# Patient Record
Sex: Male | Born: 1980 | Race: Black or African American | Hispanic: No | Marital: Single | Smoking: Current every day smoker
Health system: Southern US, Community
[De-identification: ages and names within clinical notes are randomized; demographics above are authoritative.]

## PROBLEM LIST (undated history)

## (undated) DIAGNOSIS — M25569 Pain in unspecified knee: Secondary | ICD-10-CM

## (undated) DIAGNOSIS — I671 Cerebral aneurysm, nonruptured: Secondary | ICD-10-CM

## (undated) HISTORY — PX: KNEE SURGERY: SHX244

## (undated) HISTORY — PX: BRAIN SURGERY: SHX531

---

## 2014-06-25 ENCOUNTER — Emergency Department (HOSPITAL_COMMUNITY)
Admission: EM | Admit: 2014-06-25 | Discharge: 2014-06-25 | Disposition: A | Discharge status: Home / Self Care | Payer: Self-pay | Attending: Emergency Medicine | Admitting: Emergency Medicine

## 2014-06-25 ENCOUNTER — Encounter (HOSPITAL_COMMUNITY): Payer: Self-pay | Admitting: Emergency Medicine

## 2014-06-25 DIAGNOSIS — Z9889 Other specified postprocedural states: Secondary | ICD-10-CM | POA: Insufficient documentation

## 2014-06-25 DIAGNOSIS — Z8679 Personal history of other diseases of the circulatory system: Secondary | ICD-10-CM | POA: Insufficient documentation

## 2014-06-25 DIAGNOSIS — M25561 Pain in right knee: Secondary | ICD-10-CM

## 2014-06-25 DIAGNOSIS — F17200 Nicotine dependence, unspecified, uncomplicated: Secondary | ICD-10-CM | POA: Insufficient documentation

## 2014-06-25 DIAGNOSIS — M25569 Pain in unspecified knee: Secondary | ICD-10-CM | POA: Insufficient documentation

## 2014-06-25 DIAGNOSIS — G8929 Other chronic pain: Secondary | ICD-10-CM | POA: Insufficient documentation

## 2014-06-25 HISTORY — DX: Pain in unspecified knee: M25.569

## 2014-06-25 HISTORY — DX: Cerebral aneurysm, nonruptured: I67.1

## 2014-06-25 MED ORDER — HYDROCODONE-ACETAMINOPHEN 5-325 MG PO TABS: 1.0000 | ORAL_TABLET | ORAL | Status: DC | PRN

## 2014-06-25 NOTE — ED Provider Notes (Signed)
Medical screening examination/treatment/procedure(s) were performed by non-physician practitioner and as supervising physician I was immediately available for consultation/collaboration.   EKG Interpretation None        Richardean Canalavid H Dawood Spitler, MD 06/25/14 1714

## 2014-06-25 NOTE — ED Provider Notes (Signed)
CSN: [id number]     Arrival date & time 06/25/14  [id number] History   First MD Initiated Contact with Patient 06/25/14 [phone]     Chief Complaint  Patient presents with  . Knee Pain     (Consider location/radiation/quality/duration/timing/severity/associated sxs/prior Treatment) Patient is a 33 y.o. male presenting with knee pain. The history is provided by the patient and medical records.  Knee Pain  This is a 33 y.o. F with PMH significant for chronic right knee pain, presented to the ED for acute exacerbation of pain.  Patient states he recently began working at [employer] and is squatting and lifting heavy items only on a daily basis.  States over the past week, his pain has worsened. He describes it as a headache with intermittent sharp sensation in the middle of his knee. Pain worse with weightbearing and ambulation. Prior sc[place] in [place], however patient is relocating to [facility]. He denies any numbness, paresthesias, or weakness of right leg. He is ambulating without difficulty.  Past Medical History  Diagnosis Date  . Knee pain   . Brain aneurysm    Past Surgical History  Procedure Laterality Date  . Knee surgery Right   . Brain surgery     No family history on file. History  Substance Use Topics  . Smoking status: Current Every Day Smoker  . Smokeless tobacco: Not on file  . Alcohol Use: Yes    Review of Systems  Musculoskeletal: Positive for arthralgias.  All other systems reviewed and are negative.     Allergies  Review of patient's allergies indicates no known allergies.  Home Medications   Prior to Admission medications   Not on File   BP 151/84  Pulse 76  Temp(Src) 98.7 F (37.1 C) (Oral)  Resp 20  Ht 6\' 2"  (1.88 m)  Wt 294 lb (133.358 kg)  BMI 37.73 kg/m2  SpO2 98%  Physical Exam  Nursing note and vitals reviewed. Constitutional: He is oriented to person, place, and time. He appears well-developed and  well-nourished. No distress.  HENT:  Head: Normocephalic and atraumatic.  Mouth/Throat: Oropharynx is clear and moist.  Eyes: Conjunctivae and EOM are normal. Pupils are equal, round, and reactive to light.  Neck: Normal range of motion. Neck supple.  Cardiovascular: Normal rate, regular rhythm and normal heart sounds.   Pulmonary/Chest: Effort normal and breath sounds normal. No respiratory distress. He has no wheezes.  Musculoskeletal: Normal range of motion.       Right knee: He exhibits swelling. He exhibits no ecchymosis, no deformity and no laceration. No tenderness found.  Right knee mildly swollen along medial aspect, no drainable effusion noted; no bony deformities or focal tenderness, full range of motion maintained without difficulty, DP pulse and sensation intact  Neurological: He is alert and oriented to person, place, and time.  Skin: Skin is warm and dry. He is not diaphoretic.  Psychiatric: He has a normal mood and affect.    ED Course  Procedures (including critical care time) Labs Review Labs Reviewed - No data to display  Imaging Review No results found.   EKG Interpretation None      MDM   Final diagnoses:  Knee pain, right   Atraumatic right knee pain x1 week, history of the same.  No appreciable effusion noted. Leg NVI.  Knee sleeve placed to help with swelling. Encouraged RICE routine at home for added relief.  Rx vicodin.  FU with orthopedics.  Discussed  plan with patient, he/she acknowledged understanding and agreed with plan of care.  Return precautions given for new or worsening symptoms.  Garlon Hatchet, PA-C 06/25/14 [phone]

## 2014-06-25 NOTE — Discharge Instructions (Signed)
Take the prescribed medication as directed. °Follow-up with Dr. Murphy-- call and schedule appt. °Return to the ED for new or worsening symptoms. ° °

## 2014-06-25 NOTE — ED Notes (Signed)
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

## 2014-06-25 NOTE — ED Notes (Signed)
Pt c/o right knee pain x 1 week. Pt reports that he had 2 scopes done in the past. Pt started a new job after being out of work for a long time.

## 2014-07-07 ENCOUNTER — Encounter (HOSPITAL_COMMUNITY): Payer: Self-pay | Admitting: Emergency Medicine

## 2014-07-07 ENCOUNTER — Emergency Department (HOSPITAL_COMMUNITY)
Admission: EM | Admit: 2014-07-07 | Discharge: 2014-07-07 | Disposition: A | Discharge status: Home / Self Care | Payer: Self-pay | Attending: Emergency Medicine | Admitting: Emergency Medicine

## 2014-07-07 DIAGNOSIS — Z9889 Other specified postprocedural states: Secondary | ICD-10-CM | POA: Insufficient documentation

## 2014-07-07 DIAGNOSIS — M25569 Pain in unspecified knee: Secondary | ICD-10-CM | POA: Insufficient documentation

## 2014-07-07 DIAGNOSIS — G8929 Other chronic pain: Secondary | ICD-10-CM | POA: Insufficient documentation

## 2014-07-07 DIAGNOSIS — M25561 Pain in right knee: Secondary | ICD-10-CM

## 2014-07-07 DIAGNOSIS — M25469 Effusion, unspecified knee: Secondary | ICD-10-CM | POA: Insufficient documentation

## 2014-07-07 DIAGNOSIS — E669 Obesity, unspecified: Secondary | ICD-10-CM | POA: Insufficient documentation

## 2014-07-07 DIAGNOSIS — F17200 Nicotine dependence, unspecified, uncomplicated: Secondary | ICD-10-CM | POA: Insufficient documentation

## 2014-07-07 DIAGNOSIS — Z8679 Personal history of other diseases of the circulatory system: Secondary | ICD-10-CM | POA: Insufficient documentation

## 2014-07-07 MED ORDER — NAPROXEN 250 MG PO TABS
500.0000 mg | ORAL_TABLET | Freq: Once | ORAL | Status: AC
  Administered 2014-07-07: 500 mg via ORAL
  Filled 2014-07-07: qty 2

## 2014-07-07 MED ORDER — NAPROXEN 500 MG PO TABS: 500.0000 mg | ORAL_TABLET | Freq: Two times a day (BID) | ORAL | Status: DC

## 2014-07-07 NOTE — ED Provider Notes (Signed)
CSN: [id number]     Arrival date & time 07/07/14  1744 History   This chart was scribed for Mellody DrownLauren Lawayne Hartig, PA-C working with Flint MelterElliott L Wentz, MD by Evon Slackerrance Branch, ED Scribe. This patient was seen in room TR08C/TR08C and the patient's care was started at 7:55 PM.   Chief Complaint  Patient presents with  . Knee Pain   HPI Comments: Edward BarrowsJemarqus Manning is a 33 y.o. male who presents to the Emergency Department complaining of right side knee pain. Denies recent injury. He states that movement worsens his pain. He states that this is old basketball injury. He states he has hx of 2 previous knee scopes. He states his pain has recently worsened due to being on his feet for long periods of time while at work.  He states he hasn't taken any medications prior to arrival. He states he has been wearing knee sleeve that haven't provided much relief. He states he was not able to follow up with the orthopedist because he is waiting for his insurance to transfer.  Patient is a 33 y.o. male presenting with knee pain. The history is provided by the patient. No language interpreter was used.  Knee Pain Associated symptoms: no fever       Past Medical History  Diagnosis Date  . Knee pain   . Brain aneurysm    Past Surgical History  Procedure Laterality Date  . Knee surgery Right   . Brain surgery     No family history on file. History  Substance Use Topics  . Smoking status: Current Every Day Smoker  . Smokeless tobacco: Not on file  . Alcohol Use: Yes    Review of Systems  Constitutional: Negative for fever and chills.  Musculoskeletal: Positive for arthralgias, gait problem and joint swelling.    Allergies  Review of patient's allergies indicates no known allergies.  Home Medications   Prior to Admission medications   Not on File   Triage Vitals: BP 139/76  Pulse 77  Temp(Src) 98.1 F (36.7 C)  Resp 18  Ht 6\' 2"  (1.88 m)  Wt 295 lb (133.811 kg)  BMI 37.86 kg/m2  SpO2 95%  Physical  Exam  Nursing note and vitals reviewed. Constitutional: He is oriented to person, place, and time. He appears well-developed and well-nourished.  Non-toxic appearance. He does not have a sickly appearance. He does not appear ill. No distress.  Obese male  HENT:  Head: Normocephalic and atraumatic.  Eyes: Conjunctivae and EOM are normal.  Neck: Neck supple.  Pulmonary/Chest: Effort normal. No respiratory distress.  Musculoskeletal: Normal range of motion.       Right knee: He exhibits swelling. He exhibits normal range of motion, no effusion, no deformity and no erythema. Tenderness found. Medial joint line tenderness noted.  Mild swelling to right knee. No increase in warmth.  Neurological: He is alert and oriented to person, place, and time.  Skin: Skin is warm and dry. He is not diaphoretic.  Psychiatric: He has a normal mood and affect. His behavior is normal.    ED Course  Procedures (including critical care time) DIAGNOSTIC STUDIES: Oxygen Saturation is 95% on RA, adequate by my interpretation.    COORDINATION OF CARE: 8:00 PM-Discussed treatment plan which includes ibuprofen with pt at bedside and pt agreed to plan.     Labs Review Labs Reviewed - No data to display  Imaging Review No results found.   EKG Interpretation None      MDM  Final diagnoses:  Knee pain, chronic, right   Pt presents with chronic right knee pain, previous arthroscopic surgeries in .  Recently seen on 06/25/2014 for similar complaints.  Requesting note for work. Has not followed up with Orthopedic specialist as previously instructed, has not taken any other medications other than Norco.  Able to bear weight. Plan to treat with NSAIDs, advised pt to ice and elevate.  Follow up with ortho.  Discussed that he will not be getting an excuse for work for today and tomorrow but will willing to place on light duty since this is a chronic issue and he has been seen in the past and has not followed up  as instructed. He is requesting to speak to another provider, to give him two days off of work. Discussed with Dr. Effie Shy who agreed to evaluate the patient in the ED. Pt left prior to being seen by Dr. Effie Shy. Meds given in ED:  Medications  naproxen (NAPROSYN) tablet 500 mg (500 mg Oral Given 07/07/14 2023)    Discharge Medication List as of 07/07/2014  8:52 PM    START taking these medications   Details  naproxen (NAPROSYN) 500 MG tablet Take 1 tablet (500 mg total) by mouth 2 (two) times daily with a meal., Starting 07/07/2014, Until Discontinued, Print        I personally performed the services described in this documentation, which was scribed in my presence. The recorded information has been reviewed and is accurate.       Clabe Seal, PA-C 07/08/14 1544

## 2014-07-07 NOTE — Discharge Instructions (Signed)
Call for a follow up appointment with a Family or Primary Care Provider.  Return if Symptoms worsen.   Take medication as prescribed.  Ice your knee 3-4 times a day. Elevate when you are not walking or standing.   Emergency Department Resource Guide 1) Find a Doctor and Pay Out of Pocket Although you won't have to find out who is covered by your insurance plan, it is a good idea to ask around and get recommendations. You will then need to call the office and see if the doctor you have chosen will accept you as a new patient and what types of options they offer for patients who are self-pay. Some doctors offer discounts or will set up payment plans for their patients who do not have insurance, but you will need to ask so you aren't surprised when you get to your appointment.  2) Contact Your Local Health Department Not all health departments have doctors that can see patients for sick visits, but many do, so it is worth a call to see if yours does. If you don't know where your local health department is, you can check in your phone book. The CDC also has a tool to help you locate your state's health department, and many state websites also have listings of all of their local health departments.  3) Find a Walk-in Clinic If your illness is not likely to be very severe or complicated, you may want to try a walk in clinic. These are popping up all over the country in pharmacies, drugstores, and shopping centers. They're usually staffed by nurse practitioners or physician assistants that have been trained to treat common illnesses and complaints. They're usually fairly quick and inexpensive. However, if you have serious medical issues or chronic medical problems, these are probably not your best option.  No Primary Care Doctor: - Call Health Connect at  [phone] - they can help you locate a primary care doctor that  accepts your insurance, provides certain services, etc. - Physician Referral Service-  [phone]  Chronic Pain Problems: Organization         Address  Phone   Notes  [facility]  [phone] Patients need to be referred by their primary care doctor.   Medication Assistance: Organization         Address  Phone   Notes  [facility] Medication [facility] [address] [place], [place] [address] [phone] --Must be a resident of [facility] [place] -- Must have NO insurance coverage whatsoever (no Medicaid/ Medicare, etc.) -- The pt. MUST have a primary care doctor that directs their care regularly and follows them in the community   MedAssist  [phone]   [employer]  [phone]    Agencies that provide inexpensive medical care: Organization         Address  Phone   Notes  [facility]  [phone]   [facility]    [phone]   [facility] [address] [place], [place] [address] [phone]   [facility] [address], [place] [phone]   Planned Parenthood    [phone]   [facility]    [phone]   [facility]  [address], [place] Phone:  [phone], Fax:  [phone]) [phone] Hours of Operation:  9 am - 6 pm, M-F.  Also accepts Medicaid/Medicare and  self-pay.  [facility] for [place], [address], [place] Phone: [phone], Fax: [phone]. Hours of Operation:  8:30 am - 5:30 pm, M-F.  Also accepts Medicaid and self-pay.  [facility] [place] [address] [place] Phone: [phone]   [place], [place], [place] [phone], Ext. [phone] Mondays & Thursdays: 7-9 AM.  First 15 patients are seen on a first come, first serve basis.    [place] Providers:  Organization         Address  Phone   Notes  [facility] [address],  [place] [phone] Also accepts self-pay patients.  [facility] [id number] [place], [place]  [phone]   [place], [address], [place] [phone]   [facility] [address], [place] [phone]   Lucianne Lei [address], [place]   [phone] Only accepts [place] Access [place] patients after they have their name applied to their card.   Self-Pay (no insurance) in [facility]:  Organization         Address  Phone   Notes  Sickle Cell Patients, [facility] [place] [phone]   [facility]gent Care [place] [phone]   [facility] [place]  [place], [place], [place] [phone]   [facility]/Dr. Osei-Bonsu  [address], [place] or [address], [place] [phone] Phone number for both [place] and [place] locations is the same.  Urgent Medical and [facility] [address] [place] [phone]   [facility] [address], [place] or [address] [phone]   [facility] [address] [place] [phone], phone; [phone], fax Sees patients 1st and 3rd Saturday of every month.  Must not qualify for public or private insurance (i.e. Medicaid, Medicare, [place] Health Choice, Veterans' Benefits)  Household income should be no more than 200% of the poverty level The clinic cannot treat you if you are pregnant or think you are pregnant  Sexually transmitted diseases are not treated at the clinic.    Dental Care: Organization         Address  Phone  Notes  [facility] Department of [facility] [place] [phone] Accepts children up to age 83 who are enrolled in  [place] or [place]; pregnant women with a Medicaid card; and children who have applied for Medicaid or [place] Health Choice, but were declined, whose parents can pay a reduced fee at time of service.  [facility] Department of [facility]  [address], [place] [phone] Accepts children up to age 32 who are enrolled in [place] or [place]; pregnant women with a Medicaid card; and children who have applied for Medicaid or [place] Health Choice, but were declined, whose parents can pay a reduced fee at time of service.  [facility] Adult [facility] Access PROGRAM  [place] [phone] Patients are seen by appointment only. Walk-ins are not accepted. [place] will see patients 28 years of age and older. Monday - Tuesday (8am-5pm) Most Wednesdays (8:30-5pm) $  30 per visit, cash only  [facility] Adult [employer] PROGRAM  [address], [facility] [phone] Patients are seen by appointment only. Walk-ins are not accepted. [place] will see patients 66 years of age and older. One Wednesday Evening (Monthly: Volunteer Based).  $30 per visit, cash only  [place]  [phone] for adults; Children under age 85, call Graduate Pediatric Dentistry at [phone]. Children aged 31-14, please call [phone] to request a pediatric application.  Dental services are provided in all areas of dental care including fillings, crowns and bridges, complete and partial dentures, implants, gum treatment, root canals, and extractions. Preventive care is also provided. Treatment is provided to both adults and children. Patients are selected via a lottery and there is often a waiting list.   [facility] [address]  [phone] [email or web address]   [facility] [address] [place], [place] [phone], Ext. [phone] Second and Fourth Thursday of each month, opens at 6:30  AM; Clinic ends at 9 AM.  Patients are seen on a first-come first-served basis, and a limited number are seen during each clinic.   [facility]  [address] [place], [place] [phone]   Eligibility Requirements You must have lived in [place], [place], or [place] counties for at least the last three months.   You cannot be eligible for state or federal sponsored [employer], including [employer], [place], or [employer].   You generally cannot be eligible for healthcare insurance through your employer.    How to apply: Eligibility screenings are held every Tuesday and Wednesday afternoon from 1:00 pm until 4:00 pm. You do not need an appointment for the interview!  [facility] [address] [place], [place]   [place]  [place] Department  [place]  [phone]    Behavioral Health Resources in the Community: Intensive Outpatient Programs Organization         Address  Phone  Notes  [place]. [address] [place], [place] [phone]   [facility] [address] [place], [place]   ADS: Alcohol & Drug Svcs [address], [place], [place]   [place] [address],  [place], [place] or [phone]   Substance Abuse Resources Organization         Address  Phone  Notes  Alcohol and Drug Services  [phone]   [place]  [phone]   [place]   Chinita Pester  [phone]   Residential & Outpatient Substance Abuse Program  [phone]   Psychological Services Organization         Address  Phone  Notes  [facility] [place]  [place]  [phone]   [place] [address] [place] or  [phone]    Mobile Crisis Teams Organization         Address  Phone  Notes  Therapeutic Alternatives, Mobile Crisis Care Unit  [phone]   Assertive Psychotherapeutic Services  [address] [place], [place]   [place] Levels [address], [place] [phone]    Self-Help/Support Groups Organization         Address  Phone  Notes  Mental Health Assoc. of [place] - variety of support groups  [place] Call for more information  Narcotics Anonymous (NA), Caring Services [address], [employer] [place]  2 meetings at this location   Special educational needs teacher         Address  Phone  Notes  ASAP Residential Treatment [place],    [place]  [phone]   [facility]  [address] [place] [id number], [place], [place]   [facility] [place], [place] [phone] Admissions: 8am-3pm M-F  Incentives Substance [place] [address]    [place], [place] [phone]   [facility] [address] [place], [place], [place]   [facility] [address],  [place], [place]   Insight Programs - Intensive Outpatient [address] Kristeen Mans [address], [place], [place]   [facility] ([place] [address]  [place], [place] [phone] or [phone]   [facility]idential Treatment Services (RTS) [address], [place], [place] Accepts Medicaid  [facility] [place] [address]  [place] 1-8254049705 Substance Abuse/Addiction Treatment   [facility] Organization         Address  Phone  Notes  [facility]  [phone]   Domenic Schwab, PhD [address] Arlis Porta [place], [place]   [phone] or [phone]   [place], [place] [phone]   [facility] [address],  [place], [place] [phone] Insurance/Medicaid/sponsorship through [facility] and Families [address] [place]                                    [place], [place] [phone] [place] [address] [place], [place] [phone]    Dr. Adele Schilder  [phone]   [facility] Dept. 1) [address] [place] 2) [place] 3)  [address], [place] [phone]  [phone]   [place] [phone] or [phone] (After Hours)

## 2014-07-07 NOTE — ED Notes (Signed)
Encouraged patient to follow up with ortho.  Stated that he has an appointment on Sept 8th when his insurance goes into effect.

## 2014-07-07 NOTE — ED Notes (Signed)
The pt has had rt knee pain for a long time for 2-3 years chronically.  More pain for the past week

## 2014-07-07 NOTE — ED Notes (Signed)
Pt. Upset because he has not received a note for work. States "my lively hood is an emergency. I will lose my job if I don't get a note for work". Reasoned with the patient, he is agreeable to wait for the attending physician to see him.

## 2014-07-08 NOTE — ED Provider Notes (Signed)
Medical screening examination/treatment/procedure(s) were performed by non-physician practitioner and as supervising physician I was immediately available for consultation/collaboration.  Flint MelterElliott L Jady Braggs, MD 07/08/14 [phone]

## 2014-07-19 ENCOUNTER — Encounter (HOSPITAL_COMMUNITY): Payer: Self-pay | Admitting: Emergency Medicine

## 2014-07-19 ENCOUNTER — Emergency Department (HOSPITAL_COMMUNITY)
Admission: EM | Admit: 2014-07-19 | Discharge: 2014-07-19 | Disposition: A | Discharge status: Home / Self Care | Payer: Self-pay | Attending: Emergency Medicine | Admitting: Emergency Medicine

## 2014-07-19 ENCOUNTER — Emergency Department (HOSPITAL_COMMUNITY): Payer: Self-pay

## 2014-07-19 DIAGNOSIS — M25561 Pain in right knee: Secondary | ICD-10-CM

## 2014-07-19 DIAGNOSIS — Z8679 Personal history of other diseases of the circulatory system: Secondary | ICD-10-CM | POA: Insufficient documentation

## 2014-07-19 DIAGNOSIS — M25569 Pain in unspecified knee: Secondary | ICD-10-CM | POA: Insufficient documentation

## 2014-07-19 DIAGNOSIS — F17200 Nicotine dependence, unspecified, uncomplicated: Secondary | ICD-10-CM | POA: Insufficient documentation

## 2014-07-19 MED ORDER — NAPROXEN 500 MG PO TABS: 500.0000 mg | ORAL_TABLET | Freq: Two times a day (BID) | ORAL | Status: DC

## 2014-07-19 MED ORDER — TRAMADOL HCL 50 MG PO TABS: 50.0000 mg | ORAL_TABLET | Freq: Four times a day (QID) | ORAL | Status: DC | PRN

## 2014-07-19 MED ORDER — TRAMADOL HCL 50 MG PO TABS
50.0000 mg | ORAL_TABLET | Freq: Four times a day (QID) | ORAL | Status: DC | PRN
Start: 2014-07-19 — End: 2015-01-04

## 2014-07-19 NOTE — ED Provider Notes (Signed)
CSN: [id number]     Arrival date & time 07/19/14  0520 History   First MD Initiated Contact with Patient 07/19/14 [phone]     Chief Complaint  Patient presents with  . Knee Pain     (Consider location/radiation/quality/duration/timing/severity/associated sxs/prior Treatment) HPI Edward Manning is a 33 y.o. male  who presents emergency department complaining of right knee pain. Patient with chronic knee pain after a college injury, with prior knee surgery. He states in the last month he has had increased pain in the leg. He states couple days ago he stepped off the porch wrong and since then his knee has been swollen and throbbing. Patient states he has been taking Tylenol for his pain with very little relief. He states he just moved to this area, his orthopedic specialist is in [place]. 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 Patient denies any pain in his thigh or in his calf. He denies any new major injuries. He denies any numbness or weakness in extremities. He denies any swelling in his legs. Patient states that he has had to have cortisone injections in the past which have worked. He denies any other complaints.  Past Medical History  Diagnosis Date  . Knee pain   . Brain aneurysm    Past Surgical History  Procedure Laterality Date  . Knee surgery Right   . Brain surgery     History reviewed. No pertinent family history. History  Substance Use Topics  . Smoking status: Current Every Day Smoker  . Smokeless tobacco: Not on file  . Alcohol Use: Yes    Review of Systems  Constitutional: Negative for fever and chills.  Respiratory: Negative for cough, chest tightness and shortness of breath.   Cardiovascular: Negative for chest pain, palpitations and leg swelling.  Genitourinary: Negative for dysuria, urgency, frequency and hematuria.   Musculoskeletal: Positive for arthralgias and joint swelling. Negative for neck pain and neck stiffness.  Skin: Negative for rash.  Allergic/Immunologic: Negative for immunocompromised state.  Neurological: Negative for dizziness, weakness, light-headedness, numbness and headaches.      Allergies  Review of patient's allergies indicates no known allergies.  Home Medications   Prior to Admission medications   Medication Sig Start Date End Date Taking? Authorizing Provider  acetaminophen (TYLENOL) 500 MG tablet Take 1,000 mg by mouth every 6 (six) hours as needed for moderate pain.   Yes Historical Provider, MD   BP 119/72  Pulse 71  Temp(Src) 98.1 F (36.7 C) (Oral)  Resp 20  Ht 6\' 2"  (1.88 m)  Wt 290 lb (131.543 kg)  BMI 37.22 kg/m2  SpO2 98% Physical Exam  Nursing note and vitals reviewed. Constitutional: He appears well-developed and well-nourished. No distress.  HENT:  Head: Normocephalic and atraumatic.  Eyes: Conjunctivae are normal.  Neck: Neck supple.  Cardiovascular: Normal rate, regular rhythm and normal heart sounds.   Pulmonary/Chest: Effort normal. No respiratory distress. He has no wheezes. He has no rales.  Musculoskeletal: He exhibits no edema.  Normal appearing right knee. Tender to palpation over medial and anterior joint. Full ROM of the knee joint. Pain with full flexion and extension. Negative anterior and posterior drawer signs. No laxity or pain with medial or lateral stress. No lower extremity swelling. Negative Homans sign. Pain or tenderness. Dorsal pedal pulses intact.   Neurological: He  is alert.  Skin: Skin is warm and dry.    ED Course  Procedures (including critical care time) Labs Review Labs Reviewed - No data to display  Imaging Review Dg Knee Complete 4 Views Right  07/19/2014   CLINICAL DATA:  Worsening right knee pain.  EXAM: RIGHT KNEE - COMPLETE 4+ VIEW  COMPARISON:  None.  FINDINGS: There is no evidence of fracture or  dislocation. The joint spaces are preserved. Marginal osteophytes are seen arising at all three compartments, and small wall osteophytes are seen.  Trace joint fluid remains within normal limits. The visualized soft tissues are normal in appearance.  IMPRESSION: 1. No evidence of fracture or dislocation. 2. Mild tricompartmental osteoarthritis noted.   Electronically Signed   By: Roanna Raider M.D.   On: 07/19/2014 06:11     EKG Interpretation None      MDM   Final diagnoses:  Right knee pain    Pt with right chronic knee pain. No recent injuries. Knee does not appear to be significantly swollen, no signs of infection. Home with NSAIDs, ultram, follow up with pcp.   Filed Vitals:   07/19/14 0534  BP: 119/72  Pulse: 71  Temp: 98.1 F (36.7 C)  TempSrc: Oral  Resp: 20  Height: 6\' 2"  (1.88 m)  Weight: 290 lb (131.543 kg)  SpO2: 98%      Lottie Mussel, PA-C 07/19/14 [phone]

## 2014-07-19 NOTE — ED Notes (Signed)
Pt complains of chronic knee pain, old basketball injury, nothing lately

## 2014-07-19 NOTE — Discharge Instructions (Signed)
Naprosyn for pain. Ultram for severe pain. Continue to keep elevated. Ice. Follow up with orthopedics specialist.   Knee Pain The knee is the complex joint between your thigh and your lower leg. It is made up of bones, tendons, ligaments, and cartilage. The bones that make up the knee are:  The femur in the thigh.  The tibia and fibula in the lower leg.  The patella or kneecap riding in the groove on the lower femur. CAUSES  Knee pain is a common complaint with many causes. A few of these causes are:  Injury, such as:  A ruptured ligament or tendon injury.  Torn cartilage.  Medical conditions, such as:  Gout  Arthritis  Infections  Overuse, over training, or overdoing a physical activity. Knee pain can be minor or severe. Knee pain can accompany debilitating injury. Minor knee problems often respond well to self-care measures or get well on their own. More serious injuries may need medical intervention or even surgery. SYMPTOMS The knee is complex. Symptoms of knee problems can vary widely. Some of the problems are:  Pain with movement and weight bearing.  Swelling and tenderness.  Buckling of the knee.  Inability to straighten or extend your knee.  Your knee locks and you cannot straighten it.  Warmth and redness with pain and fever.  Deformity or dislocation of the kneecap. DIAGNOSIS  Determining what is wrong may be very straight forward such as when there is an injury. It can also be challenging because of the complexity of the knee. Tests to make a diagnosis may include:  Your caregiver taking a history and doing a physical exam.  Routine X-rays can be used to rule out other problems. X-rays will not reveal a cartilage tear. Some injuries of the knee can be diagnosed by:  Arthroscopy a surgical technique by which a small video camera is inserted through tiny incisions on the sides of the knee. This procedure is used to examine and repair internal knee joint  problems. Tiny instruments can be used during arthroscopy to repair the torn knee cartilage (meniscus).  Arthrography is a radiology technique. A contrast liquid is directly injected into the knee joint. Internal structures of the knee joint then become visible on X-ray film.  An MRI scan is a non X-ray radiology procedure in which magnetic fields and a computer produce two- or three-dimensional images of the inside of the knee. Cartilage tears are often visible using an MRI scanner. MRI scans have largely replaced arthrography in diagnosing cartilage tears of the knee.  Blood work.  Examination of the fluid that helps to lubricate the knee joint (synovial fluid). This is done by taking a sample out using a needle and a syringe. TREATMENT The treatment of knee problems depends on the cause. Some of these treatments are:  Depending on the injury, proper casting, splinting, surgery, or physical therapy care will be needed.  Give yourself adequate recovery time. Do not overuse your joints. If you begin to get sore during workout routines, back off. Slow down or do fewer repetitions.  For repetitive activities such as cycling or running, maintain your strength and nutrition.  Alternate muscle groups. For example, if you are a weight lifter, work the upper body on one day and the lower body the next.  Either tight or weak muscles do not give the proper support for your knee. Tight or weak muscles do not absorb the stress placed on the knee joint. Keep the muscles surrounding the knee  strong.  Take care of mechanical problems.  If you have flat feet, orthotics or special shoes may help. See your caregiver if you need help.  Arch supports, sometimes with wedges on the inner or outer aspect of the heel, can help. These can shift pressure away from the side of the knee most bothered by osteoarthritis.  A brace called an "unloader" brace also may be used to help ease the pressure on the most  arthritic side of the knee.  If your caregiver has prescribed crutches, braces, wraps or ice, use as directed. The acronym for this is PRICE. This means protection, rest, ice, compression, and elevation.  Nonsteroidal anti-inflammatory drugs (NSAIDs), can help relieve pain. But if taken immediately after an injury, they may actually increase swelling. Take NSAIDs with food in your stomach. Stop them if you develop stomach problems. Do not take these if you have a history of ulcers, stomach pain, or bleeding from the bowel. Do not take without your caregiver's approval if you have problems with fluid retention, heart failure, or kidney problems.  For ongoing knee problems, physical therapy may be helpful.  Glucosamine and chondroitin are over-the-counter dietary supplements. Both may help relieve the pain of osteoarthritis in the knee. These medicines are different from the usual anti-inflammatory drugs. Glucosamine may decrease the rate of cartilage destruction.  Injections of a corticosteroid drug into your knee joint may help reduce the symptoms of an arthritis flare-up. They may provide pain relief that lasts a few months. You may have to wait a few months between injections. The injections do have a small increased risk of infection, water retention, and elevated blood sugar levels.  Hyaluronic acid injected into damaged joints may ease pain and provide lubrication. These injections may work by reducing inflammation. A series of shots may give relief for as long as 6 months.  Topical painkillers. Applying certain ointments to your skin may help relieve the pain and stiffness of osteoarthritis. Ask your pharmacist for suggestions. Many over the-counter products are approved for temporary relief of arthritis pain.  In some countries, doctors often prescribe topical NSAIDs for relief of chronic conditions such as arthritis and tendinitis. A review of treatment with NSAID creams found that they  worked as well as oral medications but without the serious side effects. PREVENTION  Maintain a healthy weight. Extra pounds put more strain on your joints.  Get strong, stay limber. Weak muscles are a common cause of knee injuries. Stretching is important. Include flexibility exercises in your workouts.  Be smart about exercise. If you have osteoarthritis, chronic knee pain or recurring injuries, you may need to change the way you exercise. This does not mean you have to stop being active. If your knees ache after jogging or playing basketball, consider switching to swimming, water aerobics, or other low-impact activities, at least for a few days a week. Sometimes limiting high-impact activities will provide relief.  Make sure your shoes fit well. Choose footwear that is right for your sport.  Protect your knees. Use the proper gear for knee-sensitive activities. Use kneepads when playing volleyball or laying carpet. Buckle your seat belt every time you drive. Most shattered kneecaps occur in car accidents.  Rest when you are tired. SEEK MEDICAL CARE IF:  You have knee pain that is continual and does not seem to be getting better.  SEEK IMMEDIATE MEDICAL CARE IF:  Your knee joint feels hot to the touch and you have a high fever. MAKE SURE YOU:  Understand these instructions.  Will watch your condition.  Will get help right away if you are not doing well or get worse. Document Released: 09/23/2007 Document Revised: 02/18/2012 Document Reviewed: 09/23/2007 [facility] Patient Information 2015 [place], [place]. This information is not intended to replace advice given to you by your health care provider. Make sure you discuss any questions you have with your health care provider.

## 2014-07-19 NOTE — ED Provider Notes (Signed)
Medical screening examination/treatment/procedure(s) were performed by non-physician practitioner and as supervising physician I was immediately available for consultation/collaboration.   EKG Interpretation None        Dewon Mendizabal F Vandy Tsuchiya, MD 07/19/14 0737 

## 2014-10-28 ENCOUNTER — Encounter (HOSPITAL_COMMUNITY): Payer: Self-pay | Admitting: Emergency Medicine

## 2014-10-28 ENCOUNTER — Emergency Department (HOSPITAL_COMMUNITY)
Admission: EM | Admit: 2014-10-28 | Discharge: 2014-10-28 | Disposition: A | Discharge status: Home / Self Care | Payer: Self-pay | Attending: Emergency Medicine | Admitting: Emergency Medicine

## 2014-10-28 ENCOUNTER — Emergency Department (HOSPITAL_COMMUNITY): Payer: Self-pay

## 2014-10-28 DIAGNOSIS — Z720 Tobacco use: Secondary | ICD-10-CM | POA: Insufficient documentation

## 2014-10-28 DIAGNOSIS — I671 Cerebral aneurysm, nonruptured: Secondary | ICD-10-CM | POA: Insufficient documentation

## 2014-10-28 DIAGNOSIS — R51 Headache: Secondary | ICD-10-CM | POA: Insufficient documentation

## 2014-10-28 DIAGNOSIS — R519 Headache, unspecified: Secondary | ICD-10-CM

## 2014-10-28 DIAGNOSIS — I729 Aneurysm of unspecified site: Secondary | ICD-10-CM

## 2014-10-28 DIAGNOSIS — Z791 Long term (current) use of non-steroidal anti-inflammatories (NSAID): Secondary | ICD-10-CM | POA: Insufficient documentation

## 2014-10-28 NOTE — ED Provider Notes (Signed)
CSN: [id number]     Arrival date & time 10/28/14  [id number] History   First MD Initiated Contact with Patient 10/28/14 [phone]     Chief Complaint  Patient presents with  . Headache   HPI  Patient is a 33 year old male with a past medical history of a brain aneurysm with clipping performed approximately one year ago who presents to the emergency room with intermittent headache 3-4 days. Patient states that for the past 3-4 days he has had a frontal headache with some sharp intermittent pains that go to the occipital region of his head. At its worst the pain as a 7 out of 10. Currently he is having no pain in his head at this time. Patient states that he feels that the headaches are much worse at nighttime. Especially when he gets home from work. He has been trying Tylenol which takes care of his headaches. He is worried because the headaches come right back pain next day. He feels that this may potentially be due to his aneurysm. He states that the headache is different from when he had his aneurysm. He said these headaches are much less severe than his aneurysm headache. He also reports some dizziness with movements.  Past Medical History  Diagnosis Date  . Knee pain   . Brain aneurysm    Past Surgical History  Procedure Laterality Date  . Knee surgery Right   . Brain surgery     No family history on file. History  Substance Use Topics  . Smoking status: Current Every Day Smoker  . Smokeless tobacco: Not on file  . Alcohol Use: Yes    Review of Systems  Constitutional: Positive for diaphoresis. Negative for fever, chills and fatigue.  HENT: Negative for congestion, ear pain, postnasal drip, rhinorrhea and trouble swallowing.   Musculoskeletal: Negative for neck pain and neck stiffness.  Neurological: Positive for dizziness and headaches. Negative for seizures, speech difficulty, weakness and numbness.  Psychiatric/Behavioral: Negative for confusion.      Allergies  Review of  patient's allergies indicates no known allergies.  Home Medications   Prior to Admission medications   Medication Sig Start Date End Date Taking? Authorizing Provider  acetaminophen (TYLENOL) 500 MG tablet Take 1,000 mg by mouth every 6 (six) hours as needed for moderate pain.   Yes Historical Provider, MD  Pseudoeph-Doxylamine-DM-APAP (NYQUIL PO) Take 1-2 capsules by mouth every 6 (six) hours as needed (sinus pain).   Yes Historical Provider, MD  traMADol (ULTRAM) 50 MG tablet Take 1 tablet (50 mg total) by mouth every 6 (six) hours as needed. 07/19/14  Yes Tatyana A Kirichenko, PA-C  naproxen (NAPROSYN) 500 MG tablet Take 1 tablet (500 mg total) by mouth 2 (two) times daily. 07/19/14   Tatyana A Kirichenko, PA-C   BP 119/73 mmHg  Pulse 77  Temp(Src) 98.6 F (37 C) (Oral)  Resp 15  Ht 6\' 2"  (1.88 m)  Wt 295 lb (133.811 kg)  BMI 37.86 kg/m2  SpO2 99% Physical Exam  Constitutional: He is oriented to person, place, and time. He appears well-developed and well-nourished. No distress.  HENT:  Head: Normocephalic and atraumatic.  Mouth/Throat: Oropharynx is clear and moist. No oropharyngeal exudate.  Eyes: Conjunctivae and EOM are normal. Pupils are equal, round, and reactive to light. No scleral icterus.  Neck: Normal range of motion. Neck supple. No JVD present. No Brudzinski's sign and no Kernig's sign noted. No thyromegaly present.  Cardiovascular: Normal rate, regular rhythm, normal heart sounds and  intact distal pulses.  Exam reveals no gallop and no friction rub.   No murmur heard. Pulmonary/Chest: Effort normal and breath sounds normal. No respiratory distress. He has no wheezes. He has no rales. He exhibits no tenderness.  Abdominal: Soft. Bowel sounds are normal.  Musculoskeletal: Normal range of motion.  Lymphadenopathy:    He has no cervical adenopathy.  Neurological: He is alert and oriented to person, place, and time. He has normal strength. No cranial nerve deficit or  sensory deficit. Coordination normal.  Negative pronator drift. Normal alternating rapid movements.  Skin: Skin is warm and dry. He is not diaphoretic.  Psychiatric: He has a normal mood and affect. His behavior is normal. Judgment and thought content normal.  Nursing note and vitals reviewed.   ED Course  Procedures (including critical care time) Labs Review Labs Reviewed - No data to display  Imaging Review Ct Head Wo Contrast  10/28/2014   CLINICAL DATA:  Headache.  EXAM: CT HEAD WITHOUT CONTRAST  TECHNIQUE: Contiguous axial images were obtained from the base of the skull through the vertex without intravenous contrast.  COMPARISON:  None.  FINDINGS: Status post right frontal craniotomy. Aneurysm clip is seen in the region of the right middle cerebral artery. No mass effect or midline shift is noted. Ventricular size is within normal limits. There is no evidence of mass lesion, hemorrhage or acute infarction.  IMPRESSION: Status post right frontal craniotomy with aneurysm clips seen in region of right middle cerebral artery. No acute intracranial abnormality seen.   Electronically Signed   By: Roque LiasJames  Green M.D.   On: 10/28/2014 08:50     EKG Interpretation None      MDM   Final diagnoses:  Headache  Aneurysm   Patient is a 33 year old male who presents to the ED with intermittent frontal headaches. Physical exam reveals alert and oriented male with no focal neurological deficits. Patient is afebrile in the emergency department. There are no signs of meningitis. Patient is concerned that his aneurysm may be leaking. As there are no focal neurological deficits on examination I do not feel that patient needed imaging at this time, but patient would like to have some imaging performed. I have performed a screening head CT without contrast here in the ED there is evidence of previous craniotomy with aneurysm clips seen, but there are no acute intracranial abnormalities seen on CT scan  today. Suspect that this is likely tension headaches due to stress. Patient instructed to keep a headache diary to find triggers for headaches. Will give patient the resource list and have patient follow-up with a PCP of his choosing. Patient to return for sudden onset of worst headache of his life, severe nausea and vomiting, changes in vision, weakness, or any other concerning symptoms. Patient states understanding and agreement at this time. Patient has been discussed with and seen by Dr. Madilyn Hookees who agrees with the above plan and workup. Patient is stable for discharge at this time.    Eben Burowourtney A Forcucci, PA-C 10/28/14 [id number]  Tilden FossaElizabeth Rees, MD 10/28/14 [phone]

## 2014-10-28 NOTE — ED Notes (Signed)
Patient transported to CT without distress 

## 2014-10-28 NOTE — Discharge Instructions (Signed)
Tension Headache °A tension headache is a feeling of pain, pressure, or aching often felt over the front and sides of the head. The pain can be dull or can feel tight (constricting). It is the most common type of headache. Tension headaches are not normally associated with nausea or vomiting and do not get worse with physical activity. Tension headaches can last 30 minutes to several days.  °CAUSES  °The exact cause is not known, but it may be caused by chemicals and hormones in the brain that lead to pain. Tension headaches often begin after stress, anxiety, or depression. Other triggers may include: °· Alcohol. °· Caffeine (too much or withdrawal). °· Respiratory infections (colds, flu, sinus infections). °· Dental problems or teeth clenching. °· Fatigue. °· Holding your head and neck in one position too long while using a computer. °SYMPTOMS  °· Pressure around the head.   °· Dull, aching head pain.   °· Pain felt over the front and sides of the head.   °· Tenderness in the muscles of the head, neck, and shoulders. °DIAGNOSIS  °A tension headache is often diagnosed based on:  °· Symptoms.   °· Physical examination.   °· A CT scan or MRI of your head. These tests may be ordered if symptoms are severe or unusual. °TREATMENT  °Medicines may be given to help relieve symptoms.  °HOME CARE INSTRUCTIONS  °· Only take over-the-counter or prescription medicines for pain or discomfort as directed by your caregiver.   °· Lie down in a dark, quiet room when you have a headache.   °· Keep a journal to find out what may be triggering your headaches. For example, write down: °¨ What you eat and drink. °¨ How much sleep you get. °¨ Any change to your diet or medicines. °· Try massage or other relaxation techniques.   °· Ice packs or heat applied to the head and neck can be used. Use these 3 to 4 times per day for 15 to 20 minutes each time, or as needed.   °· Limit stress.   °· Sit up straight, and do not tense your muscles.    °· Quit smoking if you smoke. °· Limit alcohol use. °· Decrease the amount of caffeine you drink, or stop drinking caffeine. °· Eat and exercise regularly. °· Get 7 to 9 hours of sleep, or as recommended by your caregiver. °· Avoid excessive use of pain medicine as recurrent headaches can occur.    °SEEK MEDICAL CARE IF:  °· You have problems with the medicines you were prescribed. °· Your medicines do not work. °· You have a change from the usual headache. °· You have nausea or vomiting. °SEEK IMMEDIATE MEDICAL CARE IF:  °· Your headache becomes severe. °· You have a fever. °· You have a stiff neck. °· You have loss of vision. °· You have muscular weakness or loss of muscle control. °· You lose your balance or have trouble walking. °· You feel faint or pass out. °· You have severe symptoms that are different from your first symptoms. °MAKE SURE YOU:  °· Understand these instructions. °· Will watch your condition. °· Will get help right away if you are not doing well or get worse. °Document Released: 11/26/2005 Document Revised: 02/18/2012 Document Reviewed: 11/16/2011 °ExitCare® Patient Information ©2015 [employer]. This information is not intended to replace advice given to you by your health care provider. Make sure you discuss any questions you have with your health care provider. ° ° °Emergency Department Resource Guide °1) Find a Doctor and Pay Out   of Pocket °Although you won't have to find out who is covered by your insurance plan, it is a good idea to ask around and get recommendations. You will then need to call the office and see if the doctor you have chosen will accept you as a new patient and what types of options they offer for patients who are self-pay. Some doctors offer discounts or will set up payment plans for their patients who do not have insurance, but you will need to ask so you aren't surprised when you get to your appointment. ° °2) Contact Your Local Health Department °Not all health  departments have doctors that can see patients for sick visits, but many do, so it is worth a call to see if yours does. If you don't know where your local health department is, you can check in your phone book. The CDC also has a tool to help you locate your state's health department, and many state websites also have listings of all of their local health departments. ° °3) Find a Walk-in Clinic °If your illness is not likely to be very severe or complicated, you may want to try a walk in clinic. These are popping up all over the country in pharmacies, drugstores, and shopping centers. They're usually staffed by nurse practitioners or physician assistants that have been trained to treat common illnesses and complaints. They're usually fairly quick and inexpensive. However, if you have serious medical issues or chronic medical problems, these are probably not your best option. ° °No Primary Care Doctor: °- Call [facility] at  832-8000 - they can help you locate a primary care doctor that  accepts your insurance, provides certain services, etc. °- Physician Referral Service- 1-800-533-3463 ° °Chronic Pain Problems: °Organization         Address  Phone   Notes  °[facility]  (336) 297-2271 Patients need to be referred by their primary care doctor.  ° °Medication Assistance: °Organization         Address  Phone   Notes  °[place] [facility] [address] °[place], [place] [address] (336) 641-8030 --Must be a resident of [place] °-- Must have NO insurance coverage whatsoever (no Medicaid/ Medicare, etc.) °-- The pt. MUST have a primary care doctor that directs their care regularly and follows them in the community °  °[facility]  (866) 331-1348   °[employer]  (888) 892-1162   ° °Agencies that provide inexpensive medical care: °Organization         Address  Phone   Notes  °[facility]  (336) 832-8035   °[facility]     (336) 832-7272   °[facility] [address] °[place], [place] [address] (336) 832-4777   °[facility] [address], °[place] (336) 271-4999   °[facility]    (336) 373-0678   °[facility]    (336) 272-1050   °[facility] ° [address], [place] Phone:  (336) 832-4444, Fax:  (336) 832-4440 Hours of Operation:  9 am - 6 pm, M-F.  Also accepts Medicaid/Medicare and self-pay.  °[facility] ° [address], [place] Phone: (336) 832-3150, Fax: (336) 832-3151. Hours of Operation:  8:30 am - 5:30 pm, M-F.  Also accepts Medicaid and self-pay.  °[facility] [place] [address], [place] Phone: (336) 878-6027   °[facility] [address], [place], [place] (336)723-1848, Ext. 123 Mondays & Thursdays: 7-9 AM.  First 15 patients are seen on a first come, first serve basis. °  ° °Medicaid-accepting [place] Providers: ° °Organization         Address  Phone   Notes  °[facility] [address], [place] (336) 641-2100 Also accepts self-pay patients.  °[facility] [address], [place] ° (336) 856-9996   °[facility] [address], [place] (336) 288-8857   °[facility] [address] [place] [address], [place] (336) 299-7000   °Veita Bland [address], [place]  ° (336) 373-1557 Only accepts [place] Access Medicaid patients after they have their name applied to their card.  ° °Self-Pay (no insurance) in [place]: ° °Organization         Address  Phone   Notes  °Sickle Cell Patients, [facility] [address], [place] (336) 832-1970   °[facility] [address], [place] (336) 832-4400   °[facility] [place] ° [address],  (336) 992-4800     °[facility]/Dr. Osei-Bonsu ° [address] [place] [address], [place] or [address], [place] (336) 841-8500 Phone number for both [place] and [place] locations is the same.  °[facility] [address], [place] (336) 299-0000   °[facility] [address] [place] [address], [place] or [address] (336) 852-7530 °(336) 878-2260   °[facility] [address], [place] (336) 350-1642, phone; (336) 294-5005, fax Sees patients 1st and 3rd Saturday of every month.  Must not qualify for public or private insurance (i.e. Medicaid, Medicare, [facility] Health Choice, Veterans' Benefits) • Household income should be no more than 200% of the poverty level •The clinic cannot treat you if you are pregnant or think you are pregnant • Sexually transmitted diseases are not treated at the clinic.  ° ° °Dental Care: °Organization         Address  Phone  Notes  °[facility] [address], [place] (336) 641-6152 Accepts children up to age 21 who are enrolled in Medicaid or [place] Health Choice; pregnant women with a Medicaid card; and children who have applied for Medicaid or [place] Health Choice, but were declined, whose parents can pay a reduced fee at time of service.  °[facility] [place]  [address], [place] (336) 641-7733 Accepts children up to age 21 who are enrolled in Medicaid or [place] Health Choice; pregnant women with a Medicaid card; and children who have applied for Medicaid or [place] Health Choice, but were declined, whose parents can pay a reduced fee at time of service.  °[facility] ° [address],  (336) 641-4533 Patients are seen by appointment only. Walk-ins are not accepted. [facility] will see patients 18 years of age and older. °Monday - Tuesday (8am-5pm) °Most Wednesdays (8:30-5pm) °$30 per visit,  cash only  °[facility] ° [address], [place] (336) 641-4533 Patients are seen by appointment only. Walk-ins are not accepted. [facility] will see patients 18 years of age and older. °One Wednesday Evening (Monthly: Volunteer Based).  $30 per visit,   cash only  °[facility]  (919) 537-3737 for adults; Children under age 4, call Graduate Pediatric Dentistry at (919) [phone]. Children aged 4-14, please call (919) [phone] to request a pediatric application. ° Dental services are provided in all areas of dental care including fillings, crowns and bridges, complete and partial dentures, implants, gum treatment, root canals, and extractions. Preventive care is also provided. Treatment is provided to both adults and children. °Patients are selected via a lottery and there is often a waiting list. °  °[facility] [address], °[place] ° (336) 763-8833 [email or web address] °  °[facility] [address], [place], [place] (336)723-1848, Ext. 123 Second and Fourth Thursday of each month, opens at 6:30 AM; Clinic ends at 9 AM.  Patients are seen on a first-come first-served basis, and a limited number are seen during each clinic.  ° °[facility] ° [address], [place], [place] (336) 723-7904   Eligibility Requirements °You must have lived in [place], [place], or [place] counties for at least the last three months. °  You cannot be eligible for state or federal sponsored healthcare insurance, including Veterans Administration, Medicaid, or Medicare. °  You generally cannot be eligible for healthcare insurance through your employer.  °  How to apply: °Eligibility screenings are held every Tuesday and Wednesday afternoon from 1:00 pm until 4:00 pm. You do not need an appointment for the interview!  °[facility] [address], [place], [place] 336-631-2330   °[facility]   336-342-8273   °[facility]  336-703-3100   °[facility]  336-570-6415   ° °Behavioral Health Resources in the Community: °Intensive Outpatient Programs °Organization         Address  Phone  Notes  °[facility] [address], [place], [place] 336-878-6098   °[facility] [address], [place], [place] 336-832-9800   °[facility] [address], [place], [place] ° 336-882-2125   °[facility] [address],  °[place], [place] 1-800-853-5163 or 336-641-4981   °Substance Abuse Resources °Organization         Address  Phone  Notes  °[facility]  336-882-2125   °[facility]  336-784-9470   °[facility]  336-285-9073   °[facility]  336-845-3988   °Residential & Outpatient Substance Abuse Program  1-800-659-3381   °Psychological Services °Organization         Address  Phone  Notes  °[facility]  336- 832-9600   °[facility]  336- 378-7881   °[facility] [address], [place] 1-800-853-5163 or 336-641-4981   ° °Mobile Crisis Teams °Organization         Address  Phone  Notes  °[facility], Mobile Crisis Care Unit  1-877-626-1772   °Assertive °[facility] ° [address] [place], [place] 336-834-9664   °Sharon DeEsch [address] °[place] 336-554-5454   ° °Self-Help/Support Groups °Organization         Address  Phone             Notes  °[facility] - variety of support groups  336- 373-1402 Call for more information  °Narcotics Anonymous (NA), [facility] [address], °[place]  2 meetings at this location  ° °Residential Treatment Programs °Organization           Address  Phone  Notes  °[facility] [address],    °[place]  1-866-801-8205   °[facility] ° [address], [place], [place] 704-293-8524    °[facility] [address], [place] 336-845-3988 Admissions: 8am-3pm M-F  °[facility] [address]    °[place], [place] 336-841-1104   °[facility] [address], [place], [place] 336-379-7146   °[facility] [address]  °[place], [place] 336-285-9073   °[facility] - Intensive Outpatient [address], [place], [place] 336-852-3033   °[facility] [address]  °[place], [place] 1-877-615-2722 or 336-784-9470   °[facility] [address] [place], [place] 336-227-7417 Accepts Medicaid  °[facility] [address]  ° [place] 1-800-659-3381 Substance Abuse/Addiction Treatment  ° °[place] Behavioral Health Resources °Organization         Address  Phone  Notes  °[facility]  (888) 581-9988   °Julie Brannon, PhD [address] [place], [place]   (336) 349-5553 or (336) 951-0000   °[facility]   [address] °[place], [place] (336) 349-4454   °[facility] [address], [place], [place] (336) 342-8316 Insurance/Medicaid/sponsorship through [facility]  °[facility] [address]                                    [place], [place] (336) 342-8316 Therapy/tele-psych/case  °[facility] [address]  ° [place],  (336) 349-2233    °Dr. Arfeen  (336) 349-4544   °[facility] [place]  [employer] [place] [facility] 1) [address],  °2) [address], [place] °3)  [address], [place] (336) 349-3220 °(336) 342-7768 ° °(336) 342-8140   °[place] [facility] (336) 342-1394 or (336) 342-3537 (After Hours)    ° ° ° °

## 2014-10-28 NOTE — Progress Notes (Signed)
[facility] Community Health & Eligibility Specialist  Spoke to patient regarding follow up care & primary care resources. Patient states he has been in the area for about 3 months and hasn't established care with a provider. Patient states he is currently waiting for his insurance card from his employer. Patient was instructed to contact me once he has his insurance information for help with finding a provider who accepts his insurance. Resource guide for temporary primary care options provided. My contact information also given for any future questions or concerns. No other Community Health & Eligibility Specialist needs identified at this time.

## 2014-10-28 NOTE — ED Notes (Signed)
Pt returned in no distress. Denies pain; no needs.

## 2014-10-28 NOTE — ED Notes (Signed)
Pt arrives with c/o headache for about a week, states he isn't sensitive to light or sound. States he's been dizzy with movement. Hx of aneurysm.

## 2015-01-03 DIAGNOSIS — Z791 Long term (current) use of non-steroidal anti-inflammatories (NSAID): Secondary | ICD-10-CM | POA: Insufficient documentation

## 2015-01-03 DIAGNOSIS — Y9389 Activity, other specified: Secondary | ICD-10-CM | POA: Insufficient documentation

## 2015-01-03 DIAGNOSIS — Y998 Other external cause status: Secondary | ICD-10-CM | POA: Insufficient documentation

## 2015-01-03 DIAGNOSIS — Z9889 Other specified postprocedural states: Secondary | ICD-10-CM | POA: Insufficient documentation

## 2015-01-03 DIAGNOSIS — W1849XA Other slipping, tripping and stumbling without falling, initial encounter: Secondary | ICD-10-CM | POA: Insufficient documentation

## 2015-01-03 DIAGNOSIS — Z8679 Personal history of other diseases of the circulatory system: Secondary | ICD-10-CM | POA: Insufficient documentation

## 2015-01-03 DIAGNOSIS — Y92009 Unspecified place in unspecified non-institutional (private) residence as the place of occurrence of the external cause: Secondary | ICD-10-CM | POA: Insufficient documentation

## 2015-01-03 DIAGNOSIS — S8991XA Unspecified injury of right lower leg, initial encounter: Secondary | ICD-10-CM | POA: Insufficient documentation

## 2015-01-03 DIAGNOSIS — Z720 Tobacco use: Secondary | ICD-10-CM | POA: Insufficient documentation

## 2015-01-04 ENCOUNTER — Emergency Department (HOSPITAL_COMMUNITY): Payer: Self-pay

## 2015-01-04 ENCOUNTER — Encounter (HOSPITAL_COMMUNITY): Payer: Self-pay | Admitting: Emergency Medicine

## 2015-01-04 ENCOUNTER — Emergency Department (HOSPITAL_COMMUNITY)
Admission: EM | Admit: 2015-01-04 | Discharge: 2015-01-04 | Disposition: A | Discharge status: Home / Self Care | Payer: Self-pay | Attending: Emergency Medicine | Admitting: Emergency Medicine

## 2015-01-04 DIAGNOSIS — S8991XA Unspecified injury of right lower leg, initial encounter: Secondary | ICD-10-CM

## 2015-01-04 MED ORDER — NAPROXEN 500 MG PO TABS: 500.0000 mg | ORAL_TABLET | Freq: Two times a day (BID) | ORAL | Status: AC

## 2015-01-04 MED ORDER — TRAMADOL HCL 50 MG PO TABS: 50.0000 mg | ORAL_TABLET | Freq: Four times a day (QID) | ORAL | Status: AC | PRN

## 2015-01-04 NOTE — ED Notes (Signed)
Pt requesting to put knee sleeve on at home after taking shower. EDP notified.

## 2015-01-04 NOTE — ED Provider Notes (Signed)
CSN: [id number]     Arrival date & time 01/03/15  2358 History   First MD Initiated Contact with Patient 01/04/15 [id number]     Chief Complaint  Patient presents with  . Knee Injury     (Consider location/radiation/quality/duration/timing/severity/associated sxs/prior Treatment) HPI Comments: Patient with a history of prior knee surgeries for meniscus and ligament injuries presents today with right knee pain.  He reports that earlier today he hyperextended his right knee when he slipped on some ice.  He denies falling.  He has been ambulatory since the injury, but does have increased pain with ambulation.  He has taken OTC pain medications without relief.  He reports mild swelling initially, but reports that it has improved.  He applied heat to the area, which he reports helped the pain.  He denies numbness or tingling.    The history is provided by the patient.    Past Medical History  Diagnosis Date  . Knee pain   . Brain aneurysm    Past Surgical History  Procedure Laterality Date  . Knee surgery Right   . Brain surgery     No family history on file. History  Substance Use Topics  . Smoking status: Current Every Day Smoker  . Smokeless tobacco: Not on file  . Alcohol Use: Yes    Review of Systems  All other systems reviewed and are negative.     Allergies  Review of patient's allergies indicates no known allergies.  Home Medications   Prior to Admission medications   Medication Sig Start Date End Date Taking? Authorizing Provider  acetaminophen (TYLENOL) 500 MG tablet Take 1,000 mg by mouth every 6 (six) hours as needed for moderate pain.    Historical Provider, MD  naproxen (NAPROSYN) 500 MG tablet Take 1 tablet (500 mg total) by mouth 2 (two) times daily. 07/19/14   Tatyana A Kirichenko, PA-C  Pseudoeph-Doxylamine-DM-APAP (NYQUIL PO) Take 1-2 capsules by mouth every 6 (six) hours as needed (sinus pain).    Historical Provider, MD  traMADol (ULTRAM) 50 MG tablet Take 1  tablet (50 mg total) by mouth every 6 (six) hours as needed. 07/19/14   Tatyana A Kirichenko, PA-C   BP 131/81 mmHg  Pulse 88  Temp(Src) 98.3 F (36.8 C) (Oral)  Resp 16  Ht 6\' 2"  (1.88 m)  Wt 280 lb (127.007 kg)  BMI 35.93 kg/m2  SpO2 99% Physical Exam  Constitutional: He appears well-developed and well-nourished.  HENT:  Head: Normocephalic and atraumatic.  Mouth/Throat: Oropharynx is clear and moist.  Neck: Normal range of motion. Neck supple.  Cardiovascular: Normal rate, regular rhythm and normal heart sounds.   Pulses:      Dorsalis pedis pulses are 2+ on the right side, and 2+ on the left side.  Pulmonary/Chest: Effort normal and breath sounds normal.  Musculoskeletal:       Right knee: He exhibits normal range of motion, no swelling, no effusion, no deformity and no erythema. Tenderness found. Medial joint line tenderness noted.  Negative posterior and anterior drawer No laxity with varus or valgus stress.  Neurological: He is alert.  Distal sensation of the right foot intact  Skin: Skin is warm and dry.  Psychiatric: He has a normal mood and affect.  Nursing note and vitals reviewed.   ED Course  Procedures (including critical care time) Labs Review Labs Reviewed - No data to display  Imaging Review Dg Knee Complete 4 Views Right  01/04/2015   CLINICAL DATA:  Hyperextension  injury yesterday, fall over knee pain. Two prior knee surgeries.  EXAM: RIGHT KNEE - COMPLETE 4+ VIEW  COMPARISON:  [facility] radiograph July 19, 2014  FINDINGS: No acute fracture deformity or dislocation. Moderate tricompartmental osteoarthrosis, unchanged. No destructive bony lesions. Soft tissue planes are nonsuspicious.  IMPRESSION: No acute fracture deformity dislocation.  Moderate tricompartmental osteoarthrosis, unchanged.   Electronically Signed   By: Awilda Metro   On: 01/04/2015 00:33     EKG Interpretation None      MDM   Final diagnoses:  None   Patient presenting with  knee pain after hyperextending his knee earlier today.  Xray negative for acute findings.  No obvious laxity of ligaments on exam.  Neurovascularly intact.  Patient did not want knee immobilizer or crutches.  Patient given knee sleeve.  Stable for discharge.  Return precautions given.    Santiago Glad, PA-C 01/04/15 0217  Santiago Glad, PA-C 01/04/15 0217  Hanley Seamen, MD 01/04/15 1610

## 2015-01-04 NOTE — Discharge Instructions (Signed)
Be sure to read and understand instructions below prior to leaving the hospital. If your symptoms persist without any improvement in 1 week it is recommended that you follow up with orthopedics listed above. Use your pain medication as prescribed and do not operate heavy machinery while on pain medication.  ° °Knee Effusion  °The medical term for having fluid in your knee is effusion.This means something is wrong inside the knee. Some of the causes of fluid in the knee may be torn cartilage, a torn ligament, or bleeding into the joint from an injury. Small tears may heal on their own with conservative treatment. Conservative means rest, limited weight bearing activity and muscle strengthening exercises. Your recovery may take up to 6 weeks. Larger tears may require surgery.  ° °TREATMENT  °Rest, ice, elevation, and compression are the basic modes of treatment.   °Apply ice to the sore area for 15 to 20 minutes, 3 to 4 times per day. Do this while you are awake for the first 2 days, or as directed. This can be stopped when the swelling goes away. Put the ice in a plastic bag and place a towel between the bag of ice and your skin.  °Keep your leg elevated when possible to lessen swelling.  °If your caregiver recommends crutches, use them as instructed for 1 week. Then, you may walk as tolerated.  °Do not drive a vehicle on pain medication. °ACTIVITY: °           - Weight bearing as tolerated °           - Exercises should be limited to pain free range of motion ° °Knee Immobilization:: This is used to support and protect an injured or painful knee. Knee immobilizers keep your knee from being used while it is healing.  °Use powder to control irritation from sweat and friction.  °Adjust the immobilizer to be firm but not tight. Signs of an immobilizer that is too tight include:  ° Swelling.  ° Numbness.  ° Color change in your foot or ankle.  ° Increased pain.  °While resting, raise your leg above the level of your  heart. This reduces throbbing and helps healing. Prop it up with pillows.  °Remove the immobilizer to bathe and sleep. Wear it other times until you see your doctor again.  °             °SEEK MEDICAL CARE IF:  °You have an increase in bruising, swelling, or pain.  °Your toes feel cold.  °Pain relief is not achieved with medications.  °EMERGENCY:: Your toes are numb or blue or you have severe pain.  °You notice redness, swelling, warmth or increasing pain in your knee.  °An unexplained oral temperature above 102° F (38.9° C) develops. ° °COLD THERAPY DIRECTIONS:  °Ice or gel packs can be used to reduce both pain and swelling. Ice is the most helpful within the first 24 to 48 hours after an injury or flareup from overusing a muscle or joint.  Ice is effective, has very few side effects, and is safe for most people to use.  ° °If you expose your skin to cold temperatures for too long or without the proper protection, you can damage your skin or nerves. Watch for signs of skin damage due to cold.  ° °HOME CARE INSTRUCTIONS  °Follow these tips to use ice and cold packs safely.  °Place a dry or damp towel between the ice and skin. A damp   towel will cool the skin more quickly, so you may need to shorten the time that the ice is used.  °For a more rapid response, add gentle compression to the ice.  °Ice for no more than 10 to 20 minutes at a time. The bonier the area you are icing, the less time it will take to get the benefits of ice.  °Check your skin after 5 minutes to make sure there are no signs of a poor response to cold or skin damage.  °Rest 20 minutes or more in between uses.  °Once your skin is numb, you can end your treatment. You can test numbness by very lightly touching your skin. The touch should be so light that you do not see the skin dimple from the pressure of your fingertip. When using ice, most people will feel these normal sensations in this order: cold, burning, aching, and numbness.  °Do not use ice  on someone who cannot communicate their responses to pain, such as small children or people with dementia.  ° °HOW TO MAKE AN ICE PACK  °To make an ice pack, do one of the following:  °Place crushed ice or a bag of frozen vegetables in a sealable plastic bag. Squeeze out the excess air. Place this bag inside another plastic bag. Slide the bag into a pillowcase or place a damp towel between your skin and the bag.  °Mix 3 parts water with 1 part rubbing alcohol. Freeze the mixture in a sealable plastic bag. When you remove the mixture from the freezer, it will be slushy. Squeeze out the excess air. Place this bag inside another plastic bag. Slide the bag into a pillowcase or place a damp towel between your s ° ° ° ° ° °

## 2015-01-04 NOTE — ED Notes (Signed)
Pt. slipped at home this afternoon reports pain at right knee . Ambulatory .

## 2015-01-04 NOTE — ED Notes (Signed)
Pt a/o x 4 on d/c. 

## 2015-06-22 ENCOUNTER — Emergency Department (HOSPITAL_COMMUNITY)
Admission: EM | Admit: 2015-06-22 | Discharge: 2015-06-22 | Disposition: A | Discharge status: Home / Self Care | Payer: Self-pay | Attending: Emergency Medicine | Admitting: Emergency Medicine

## 2015-06-22 ENCOUNTER — Encounter (HOSPITAL_COMMUNITY): Payer: Self-pay | Admitting: *Deleted

## 2015-06-22 ENCOUNTER — Emergency Department (HOSPITAL_COMMUNITY): Payer: Self-pay

## 2015-06-22 DIAGNOSIS — Z791 Long term (current) use of non-steroidal anti-inflammatories (NSAID): Secondary | ICD-10-CM | POA: Insufficient documentation

## 2015-06-22 DIAGNOSIS — Z720 Tobacco use: Secondary | ICD-10-CM | POA: Insufficient documentation

## 2015-06-22 DIAGNOSIS — Z8679 Personal history of other diseases of the circulatory system: Secondary | ICD-10-CM | POA: Insufficient documentation

## 2015-06-22 DIAGNOSIS — N200 Calculus of kidney: Secondary | ICD-10-CM | POA: Insufficient documentation

## 2015-06-22 LAB — URINALYSIS, ROUTINE W REFLEX MICROSCOPIC
Bilirubin Urine: NEGATIVE
Glucose, UA: NEGATIVE mg/dL
Ketones, ur: NEGATIVE mg/dL
Leukocytes, UA: NEGATIVE
Nitrite: NEGATIVE
Protein, ur: NEGATIVE mg/dL
Specific Gravity, Urine: 1.018 (ref 1.005–1.030)
Urobilinogen, UA: 1 mg/dL (ref 0.0–1.0)
pH: 5.5 (ref 5.0–8.0)

## 2015-06-22 LAB — CBC WITH DIFFERENTIAL/PLATELET
Basophils Absolute: 0 K/uL (ref 0.0–0.1)
Basophils Relative: 0 % (ref 0–1)
Eosinophils Absolute: 0.1 K/uL (ref 0.0–0.7)
Eosinophils Relative: 2 % (ref 0–5)
HCT: 45.1 % (ref 39.0–52.0)
Hemoglobin: 14.5 g/dL (ref 13.0–17.0)
Lymphocytes Relative: 49 % — ABNORMAL HIGH (ref 12–46)
Lymphs Abs: 2.7 K/uL (ref 0.7–4.0)
MCH: 28.3 pg (ref 26.0–34.0)
MCHC: 32.2 g/dL (ref 30.0–36.0)
MCV: 87.9 fL (ref 78.0–100.0)
Monocytes Absolute: 0.3 K/uL (ref 0.1–1.0)
Monocytes Relative: 6 % (ref 3–12)
Neutro Abs: 2.4 K/uL (ref 1.7–7.7)
Neutrophils Relative %: 43 % (ref 43–77)
Platelets: 259 K/uL (ref 150–400)
RBC: 5.13 MIL/uL (ref 4.22–5.81)
RDW: 12.4 % (ref 11.5–15.5)
WBC: 5.5 K/uL (ref 4.0–10.5)

## 2015-06-22 LAB — COMPREHENSIVE METABOLIC PANEL WITH GFR
ALT: 26 U/L (ref 17–63)
AST: 22 U/L (ref 15–41)
Albumin: 3.7 g/dL (ref 3.5–5.0)
Alkaline Phosphatase: 61 U/L (ref 38–126)
Anion gap: 7 (ref 5–15)
BUN: 6 mg/dL (ref 6–20)
CO2: 26 mmol/L (ref 22–32)
Calcium: 9.1 mg/dL (ref 8.9–10.3)
Chloride: 107 mmol/L (ref 101–111)
Creatinine, Ser: 1.1 mg/dL (ref 0.61–1.24)
GFR calc Af Amer: >60 mL/min (ref >60)
GFR calc non Af Amer: >60 mL/min (ref >60)
Glucose, Bld: 117 mg/dL — ABNORMAL HIGH (ref 65–99)
Potassium: 4 mmol/L (ref 3.5–5.1)
Sodium: 140 mmol/L (ref 135–145)
Total Bilirubin: 0.6 mg/dL (ref 0.3–1.2)
Total Protein: 6.3 g/dL — ABNORMAL LOW (ref 6.5–8.1)

## 2015-06-22 LAB — URINE MICROSCOPIC-ADD ON

## 2015-06-22 MED ORDER — OXYCODONE-ACETAMINOPHEN 5-325 MG PO TABS: 1.0000 | ORAL_TABLET | Freq: Four times a day (QID) | ORAL | Status: AC | PRN

## 2015-06-22 MED ORDER — ACETAMINOPHEN 325 MG PO TABS
650.0000 mg | ORAL_TABLET | Freq: Once | ORAL | Status: AC
  Administered 2015-06-22: 650 mg via ORAL
  Filled 2015-06-22: qty 2

## 2015-06-22 NOTE — Discharge Instructions (Signed)
Kidney Stones °Kidney stones (urolithiasis) are solid masses that form inside your kidneys. The intense pain is caused by the stone moving through the kidney, ureter, bladder, and urethra (urinary tract). When the stone moves, the ureter starts to spasm around the stone. The stone is usually passed in your pee (urine).  °HOME CARE °· Drink enough fluids to keep your pee clear or pale yellow. This helps to get the stone out. °· Strain all pee through the provided strainer. Do not pee without peeing through the strainer, not even once. If you pee the stone out, catch it in the strainer. The stone may be as small as a grain of salt. Take this to your doctor. This will help your doctor figure out what you can do to try to prevent more kidney stones. °· Only take medicine as told by your doctor. °· Follow up with your doctor as told. °· Get follow-up X-rays as told by your doctor. °GET HELP IF: °You have pain that gets worse even if you have been taking pain medicine. °GET HELP RIGHT AWAY IF:  °· Your pain does not get better with medicine. °· You have a fever or shaking chills. °· Your pain increases and gets worse over 18 hours. °· You have new belly (abdominal) pain. °· You feel faint or pass out. °· You are unable to pee. °MAKE SURE YOU:  °· Understand these instructions. °· Will watch your condition. °· Will get help right away if you are not doing well or get worse. °Document Released: 05/14/2008 Document Revised: 07/29/2013 Document Reviewed: 04/29/2013 °ExitCare® Patient Information ©2015 [employer]. This information is not intended to replace advice given to you by your health care provider. Make sure you discuss any questions you have with your health care provider. ° °Dietary Guidelines to Help Prevent Kidney Stones °Your risk of kidney stones can be decreased by adjusting the foods you eat. The most important thing you can do is drink enough fluid. You should drink enough fluid to keep your urine clear or  pale yellow. The following guidelines provide specific information for the type of kidney stone you have had. °GUIDELINES ACCORDING TO TYPE OF KIDNEY STONE °Calcium Oxalate Kidney Stones °· Reduce the amount of salt you eat. Foods that have a lot of salt cause your body to release excess calcium into your urine. The excess calcium can combine with a substance called oxalate to form kidney stones. °· Reduce the amount of animal protein you eat if the amount you eat is excessive. Animal protein causes your body to release excess calcium into your urine. Ask your dietitian how much protein from animal sources you should be eating. °· Avoid foods that are high in oxalates. If you take vitamins, they should have less than 500 mg of vitamin C. Your body turns vitamin C into oxalates. You do not need to avoid fruits and vegetables high in vitamin C. °Calcium Phosphate Kidney Stones °· Reduce the amount of salt you eat to help prevent the release of excess calcium into your urine. °· Reduce the amount of animal protein you eat if the amount you eat is excessive. Animal protein causes your body to release excess calcium into your urine. Ask your dietitian how much protein from animal sources you should be eating. °· Get enough calcium from food or take a calcium supplement (ask your dietitian for recommendations). Food sources of calcium that do not increase your risk of kidney stones include: °¨ Broccoli. °¨ Dairy products,   such as cheese and yogurt. °¨ Pudding. °Uric Acid Kidney Stones °· Do not have more than 6 oz of animal protein per day. °FOOD SOURCES °Animal Protein Sources °· Meat (all types). °· Poultry. °· Eggs. °· Fish, seafood. °Foods High in Salt °· Salt seasonings. °· Soy sauce. °· Teriyaki sauce. °· Cured and processed meats. °· Salted crackers and snack foods. °· Fast food. °· Canned soups and most canned foods. °Foods High in Oxalates °· Grains: °¨ Amaranth. °¨ Barley. °¨ Grits. °¨ Wheat  germ. °¨ Bran. °¨ Buckwheat flour. °¨ All bran cereals. °¨ Pretzels. °¨ Whole wheat bread. °· Vegetables: °¨ Beans (wax). °¨ Beets and beet greens. °¨ Collard greens. °¨ Eggplant. °¨ Escarole. °¨ Leeks. °¨ Okra. °¨ Parsley. °¨ Rutabagas. °¨ Spinach. °¨ Swiss chard. °¨ Tomato paste. °¨ Fried potatoes. °¨ Sweet potatoes. °· Fruits: °¨ Red currants. °¨ Figs. °¨ Kiwi. °¨ Rhubarb. °· Meat and Other Protein Sources: °¨ Beans (dried). °¨ Soy burgers and other soybean products. °¨ Miso. °¨ Nuts (peanuts, almonds, pecans, cashews, hazelnuts). °¨ Nut butters. °¨ Sesame seeds and tahini (paste made of sesame seeds). °¨ Poppy seeds. °· Beverages: °¨ Chocolate drink mixes. °¨ Soy milk. °¨ Instant iced tea. °¨ Juices made from high-oxalate fruits or vegetables. °· Other: °¨ Carob. °¨ Chocolate. °¨ Fruitcake. °¨ Marmalades. °Document Released: 03/23/2011 Document Revised: 12/01/2013 Document Reviewed: 10/23/2013 °ExitCare® Patient Information ©2015 [employer]. This information is not intended to replace advice given to you by your health care provider. Make sure you discuss any questions you have with your health care provider. ° °

## 2015-06-22 NOTE — ED Notes (Signed)
Pt reports left flank pain x 1 week.

## 2015-06-22 NOTE — ED Provider Notes (Signed)
CSN: [id number]     Arrival date & time 06/22/15  7829 History   First MD Initiated Contact with Patient 06/22/15 0725     Chief Complaint  Patient presents with  . Flank Pain     (Consider location/radiation/quality/duration/timing/severity/associated sxs/prior Treatment) Patient is a 34 y.o. male presenting with flank pain. The history is provided by the patient.  Flank Pain This is a new problem. Episode onset: 10 days ago. The problem occurs constantly. The problem has not changed since onset.Pertinent negatives include no chest pain, no abdominal pain, no headaches and no shortness of breath. Exacerbated by: movement of torso. The symptoms are relieved by rest. He has tried acetaminophen for the symptoms. The treatment provided mild relief.    Past Medical History  Diagnosis Date  . Knee pain   . Brain aneurysm    Past Surgical History  Procedure Laterality Date  . Knee surgery Right   . Brain surgery     History reviewed. No pertinent family history. History  Substance Use Topics  . Smoking status: Current Every Day Smoker  . Smokeless tobacco: Not on file  . Alcohol Use: Yes    Review of Systems  Constitutional: Negative for fever.  HENT: Negative for drooling and rhinorrhea.   Eyes: Negative for pain.  Respiratory: Negative for cough and shortness of breath.   Cardiovascular: Negative for chest pain and leg swelling.  Gastrointestinal: Negative for nausea, vomiting, abdominal pain and diarrhea.  Genitourinary: Positive for flank pain. Negative for dysuria and hematuria.  Musculoskeletal: Negative for gait problem and neck pain.  Skin: Negative for color change.  Neurological: Negative for numbness and headaches.  Hematological: Negative for adenopathy.  Psychiatric/Behavioral: Negative for behavioral problems.  All other systems reviewed and are negative.     Allergies  Review of patient's allergies indicates no known allergies.  Home Medications    Prior to Admission medications   Medication Sig Start Date End Date Taking? Authorizing Provider  acetaminophen (TYLENOL) 500 MG tablet Take 1,000 mg by mouth every 6 (six) hours as needed for moderate pain.    Historical Provider, MD  naproxen (NAPROSYN) 500 MG tablet Take 1 tablet (500 mg total) by mouth 2 (two) times daily. 01/04/15   Heather Laisure, PA-C  Pseudoeph-Doxylamine-DM-APAP (NYQUIL PO) Take 1-2 capsules by mouth every 6 (six) hours as needed (sinus pain).    Historical Provider, MD  traMADol (ULTRAM) 50 MG tablet Take 1 tablet (50 mg total) by mouth every 6 (six) hours as needed. 01/04/15   Heather Laisure, PA-C   BP 143/81 mmHg  Pulse 74  Temp(Src) 98.2 F (36.8 C) (Oral)  Resp 16  Ht  (1.88 m)  Wt 285 lb (129.275 kg)  BMI 36.58 kg/m2  SpO2 100% Physical Exam  Constitutional: He is oriented to person, place, and time. He appears well-developed and well-nourished.  HENT:  Head: Normocephalic and atraumatic.  Right Ear: External ear normal.  Left Ear: External ear normal.  Nose: Nose normal.  Mouth/Throat: Oropharynx is clear and moist. No oropharyngeal exudate.  Eyes: Conjunctivae and EOM are normal. Pupils are equal, round, and reactive to light.  Neck: Normal range of motion. Neck supple.  Cardiovascular: Normal rate, regular rhythm, normal heart sounds and intact distal pulses.  Exam reveals no gallop and no friction rub.   No murmur heard. Pulmonary/Chest: Effort normal and breath sounds normal. No respiratory distress. He has no wheezes.  Abdominal: Soft. Bowel sounds are normal. He exhibits no distension. There  is no tenderness. There is no rebound and no guarding.  Musculoskeletal: Normal range of motion. He exhibits tenderness (mild tenderness to palpation of the left posterior flank. No vertebral tenderness noted. Pain is reproduced with rotation of the torso.). He exhibits no edema.  Neurological: He is alert and oriented to person, place, and time.   Skin: Skin is warm and dry.  Psychiatric: He has a normal mood and affect. His behavior is normal.  Nursing note and vitals reviewed.   ED Course  Procedures (including critical care time) Labs Review Labs Reviewed  CBC WITH DIFFERENTIAL/PLATELET - Abnormal; Notable for the following:    Lymphocytes Relative 49 (*)    All other components within normal limits  COMPREHENSIVE METABOLIC PANEL - Abnormal; Notable for the following:    Glucose, Bld 117 (*)    Total Protein 6.3 (*)    All other components within normal limits  URINALYSIS, ROUTINE W REFLEX MICROSCOPIC (NOT AT [facility]) - Abnormal; Notable for the following:    Hgb urine dipstick LARGE (*)    All other components within normal limits  URINE CULTURE  URINE MICROSCOPIC-ADD ON    Imaging Review Ct Abdomen Pelvis Wo Contrast  06/22/2015   CLINICAL DATA:  Ten day history of left flank pain  EXAM: CT ABDOMEN AND PELVIS WITHOUT CONTRAST  TECHNIQUE: Multidetector CT imaging of the abdomen and pelvis was performed following the standard protocol without oral or intravenous contrast material administration.  COMPARISON:  None.  FINDINGS: Lung bases are clear.  No focal liver lesions are identified on this noncontrast enhanced study. Gallbladder wall is not appreciably thickened. There is no biliary duct dilatation.  Spleen, pancreas, and adrenals appear normal.  Kidneys bilaterally show no mass or hydronephrosis on either side. There is no renal or ureteral calculus on either side.  There is a calculus in the urinary bladder slightly to the right of midline measuring 1.0 x 0.6 cm. The wall of the urinary bladder is borderline thickened.  In the pelvis, there is no mass or fluid collection. There are scattered sigmoid diverticula without diverticulitis. Appendix appears normal.  There is no bowel obstruction. No free air or portal venous air. There is no appreciable ascites, adenopathy, or abscess in the abdomen or pelvis. There is no abdominal  aortic aneurysm. There are no blastic or lytic bone lesions.  IMPRESSION: There is a calculus within the urinary bladder, posterior and slightly to the right of midline. Patient may well have had recent calculus passage, although currently there is no demonstrable hydronephrosis or ureterectasis on either side. No ureteral calculi are currently appreciable. It must be noted that the current right urinary bladder calculus is near the right ureterovesical junction. This calculus conceivably could at least partially remain in the right ureterovesical junction, although the lack of hydronephrosis or ureterectasis on the right suggests that there is no obstruction of the right ureterovesical junction. Note that there is no left-sided renal or ureteral calculus.  Urinary bladder wall borderline thickened. Correlation with urinalysis to assess for possible degree of cystitis would be reasonable.  Sigmoid diverticula without diverticulitis. No bowel obstruction. No abscess. Appendix appears normal.   Electronically Signed   By: Bretta Bang III M.D.   On: 06/22/2015 08:43     EKG Interpretation None      MDM   Final diagnoses:  Nephrolithiasis    7:48 AM 34 y.o. male with a history of brain aneurysm status post repair who presents with left flank pain which  began about 10 days ago while on a trip to [place]. He denies any fevers, vomiting, diarrhea. He denies any dysuria. He denies any injuries. He states that his symptoms started after waiting too long to urinate. His pain is reproducible on exam with palpation. We'll get screening labs and imaging. Tylenol for pain.  10:09 AM: Sx likely related to stone which is suspected to be in the bladder. Did send urine cx d/t thickened bladder on CT, but UA unremarkable except for blood and pt denies dysuria.  I have discussed the diagnosis/risks/treatment options with the patient and believe the pt to be eligible for discharge home to follow-up with his pcp  and URO as needed. We also discussed returning to the ED immediately if new or worsening sx occur. We discussed the sx which are most concerning (e.g., worsening pain, fever) that necessitate immediate return. Medications administered to the patient during their visit and any new prescriptions provided to the patient are listed below.  Medications given during this visit Medications  acetaminophen (TYLENOL) tablet 650 mg (650 mg Oral Given 06/22/15 0809)    New Prescriptions   OXYCODONE-ACETAMINOPHEN (PERCOCET) 5-325 MG PER TABLET    Take 1 tablet by mouth every 6 (six) hours as needed for moderate pain.     Purvis SheffieldForrest Boleslaus Holloway, MD 06/22/15 1010

## 2015-06-24 LAB — URINE CULTURE

## 2015-08-29 IMAGING — CR DG KNEE COMPLETE 4+V*R*
4 series · 4 of 4 positions shown · non-contrast
Comparison: None.

CLINICAL DATA: Worsening right knee pain.

EXAM:
RIGHT KNEE - COMPLETE 4+ VIEW

[t knee ap right]
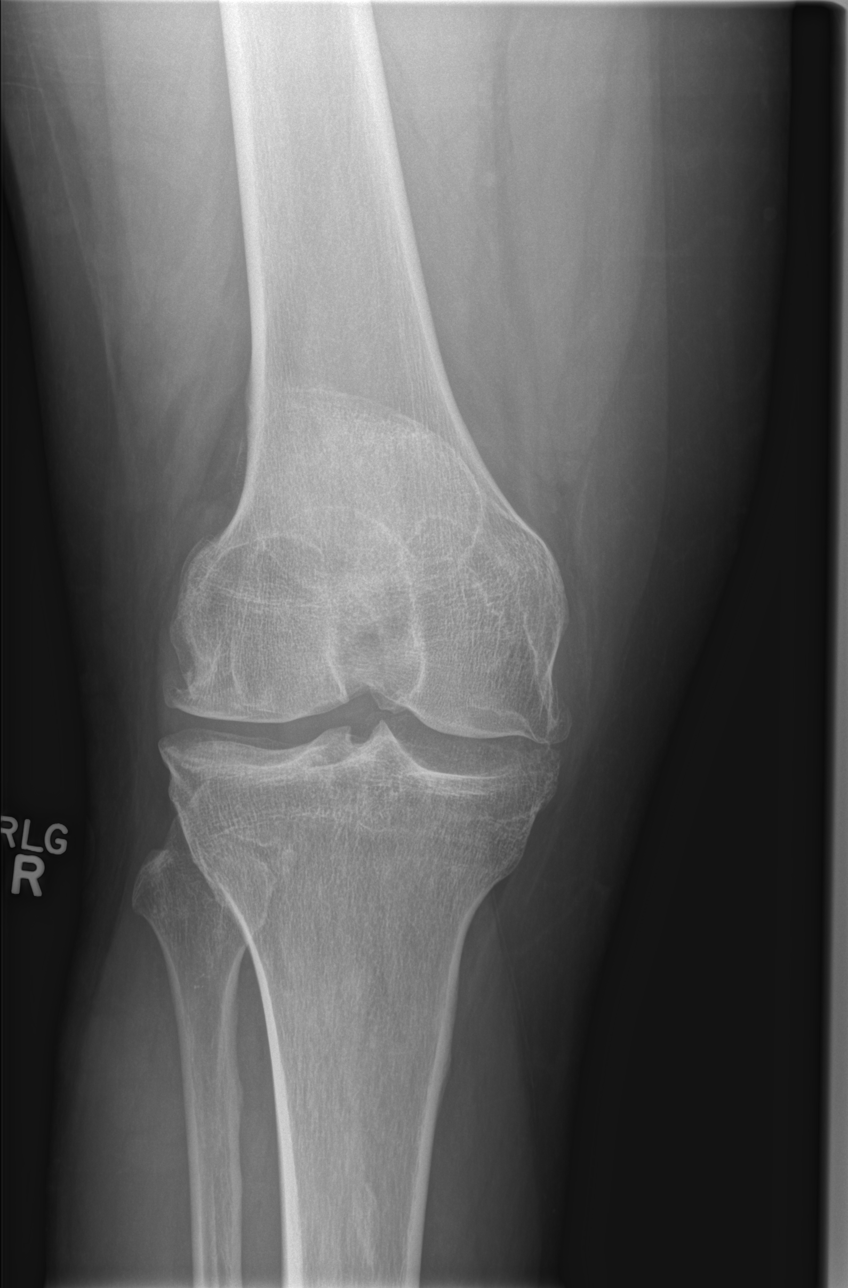

[t knee obl right (1 of 2)]
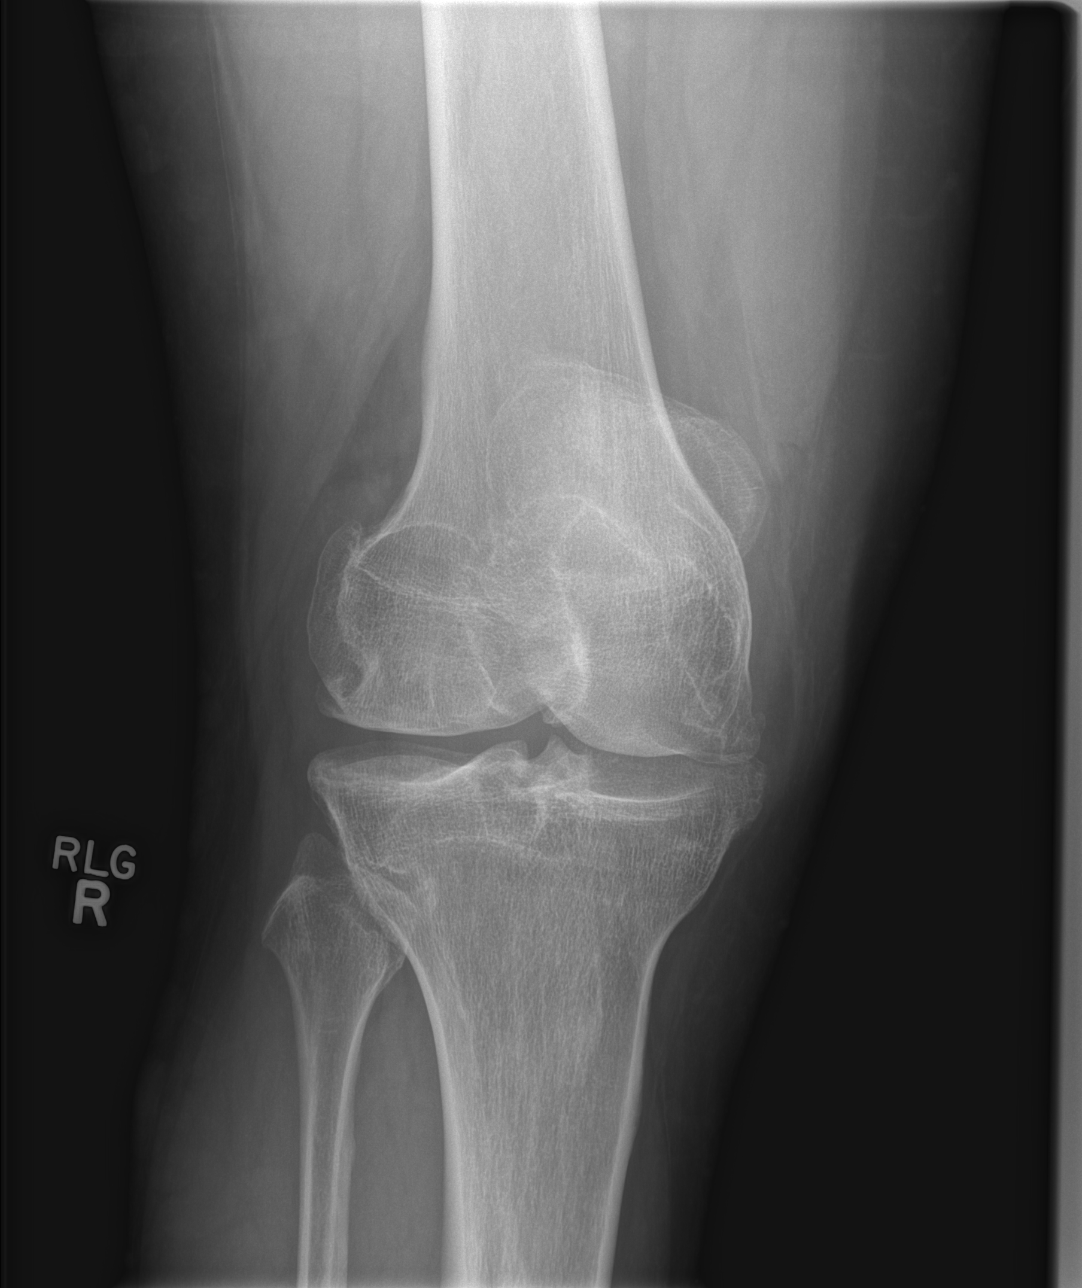

[t knee obl right (2 of 2)]
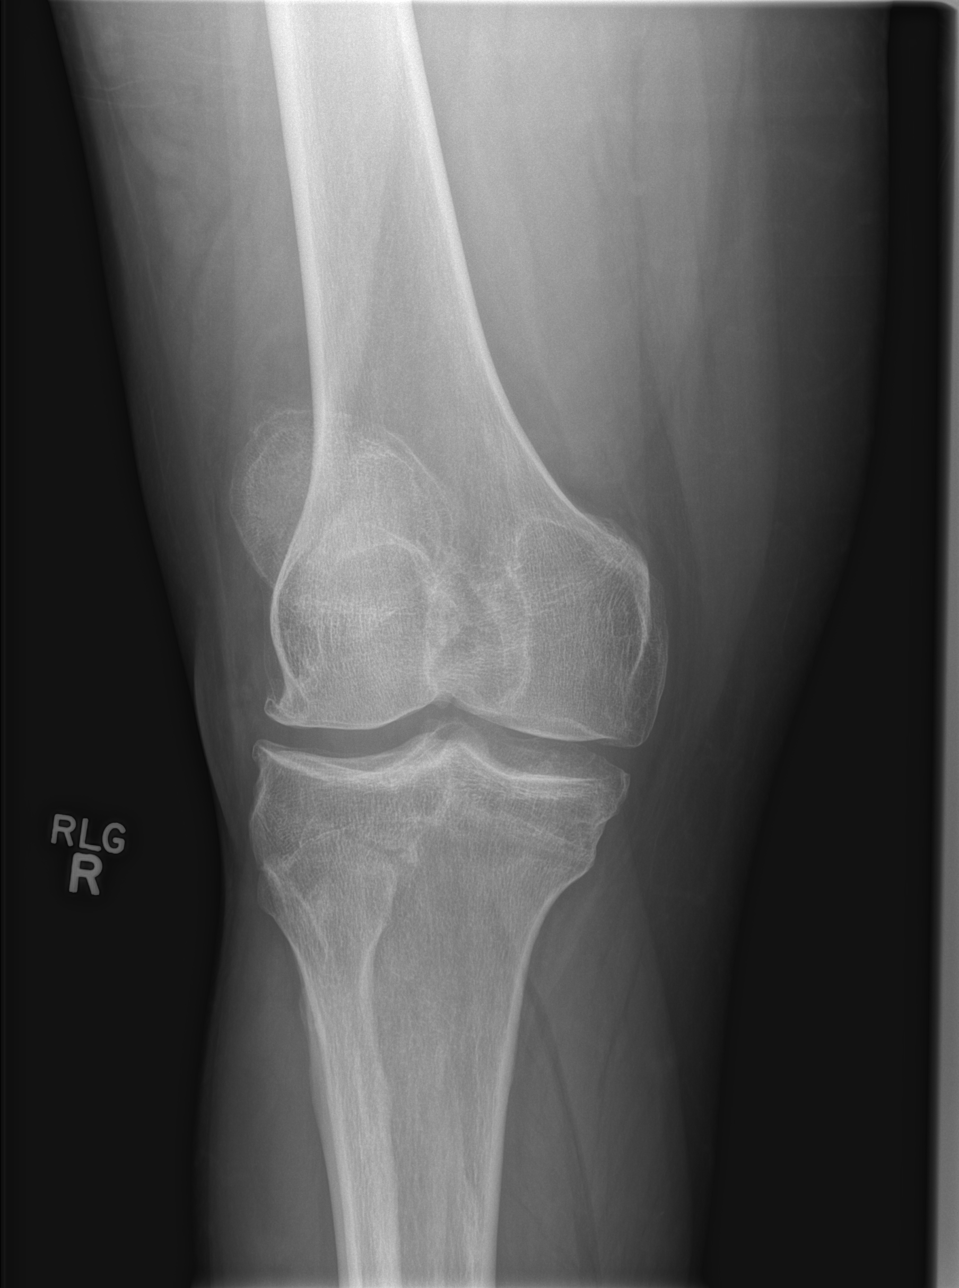

[t knee lat right]
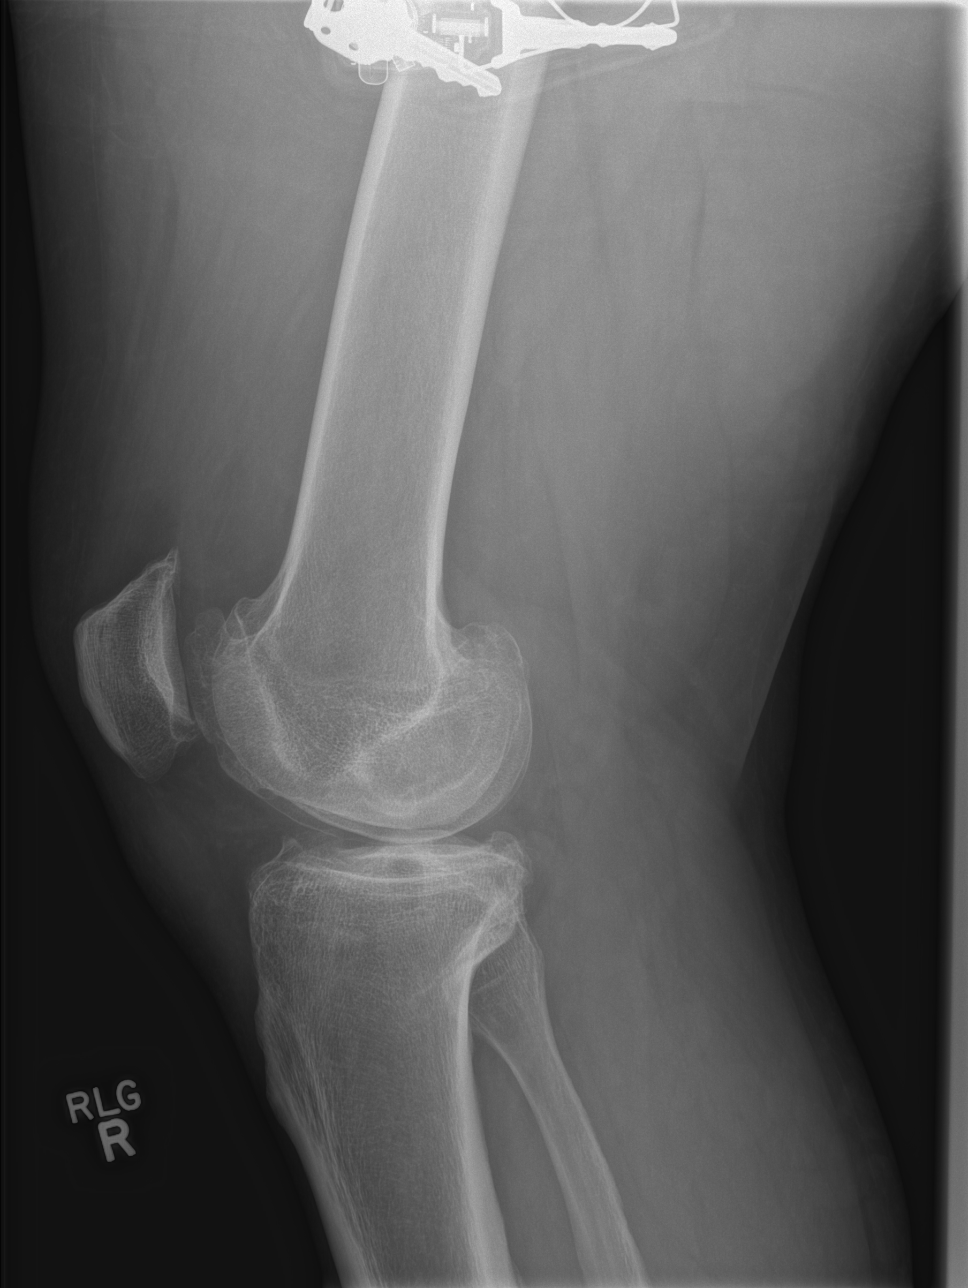

[4 of 4 positions shown; findings below may reference images not displayed]

FINDINGS: There is no evidence of fracture or dislocation. The joint spaces
are preserved. Marginal osteophytes are seen arising at all three
compartments, and small wall osteophytes are seen.

Trace joint fluid remains within normal limits. The visualized soft
tissues are normal in appearance.
IMPRESSION: 1. No evidence of fracture or dislocation.
2. Mild tricompartmental osteoarthritis noted.

## 2016-04-25 ENCOUNTER — Emergency Department (HOSPITAL_COMMUNITY)
Admission: EM | Admit: 2016-04-25 | Discharge: 2016-04-25 | Disposition: A | Discharge status: Home / Self Care | Payer: Self-pay | Attending: Emergency Medicine | Admitting: Emergency Medicine

## 2016-04-25 ENCOUNTER — Encounter (HOSPITAL_COMMUNITY): Payer: Self-pay

## 2016-04-25 DIAGNOSIS — R590 Localized enlarged lymph nodes: Secondary | ICD-10-CM | POA: Insufficient documentation

## 2016-04-25 DIAGNOSIS — F17200 Nicotine dependence, unspecified, uncomplicated: Secondary | ICD-10-CM | POA: Insufficient documentation

## 2016-04-25 DIAGNOSIS — Z791 Long term (current) use of non-steroidal anti-inflammatories (NSAID): Secondary | ICD-10-CM | POA: Insufficient documentation

## 2016-04-25 DIAGNOSIS — R6884 Jaw pain: Secondary | ICD-10-CM | POA: Insufficient documentation

## 2016-04-25 DIAGNOSIS — Z8679 Personal history of other diseases of the circulatory system: Secondary | ICD-10-CM | POA: Insufficient documentation

## 2016-04-25 MED ORDER — AMOXICILLIN-POT CLAVULANATE 875-125 MG PO TABS: 1.0000 | ORAL_TABLET | Freq: Two times a day (BID) | ORAL | Status: AC

## 2016-04-25 MED ORDER — IBUPROFEN 400 MG PO TABS
800.0000 mg | ORAL_TABLET | Freq: Once | ORAL | Status: AC
  Administered 2016-04-25: 800 mg via ORAL
  Filled 2016-04-25: qty 2

## 2016-04-25 MED ORDER — AMOXICILLIN-POT CLAVULANATE 875-125 MG PO TABS
1.0000 | ORAL_TABLET | Freq: Once | ORAL | Status: AC
  Administered 2016-04-25: 1 via ORAL
  Filled 2016-04-25: qty 1

## 2016-04-25 NOTE — ED Provider Notes (Signed)
CSN: [id number]     Arrival date & time 04/25/16  1953 History  By signing my name below, I, Edward Manning, attest that this documentation has been prepared under the direction and in the presence of Melton Krebs, PA-C. Electronically Signed: [name], ED Scribe. 04/25/2016. 9:11 PM.    Chief Complaint  Patient presents with  . Jaw Pain    The history is provided by the patient. No language interpreter was used.   HPI Comments: Edward Manning is a 35 y.o. male who presents to the Emergency Department complaining of left sided jaw pain onset this morning. Pt states that the pain is worse when opening and closing his mouth. Pt does report recently having a sinus infection, treated with z-pack. Pt doesn't report any medications tried for the jaw pain PTA. Pt denies fever, chills, sore throat, HA, CP, SOB, abdominal pain, nausea or vomiting. Pt does report Hx of brain aneurysm surgery.   Past Medical History  Diagnosis Date  . Knee pain   . Brain aneurysm    Past Surgical History  Procedure Laterality Date  . Knee surgery Right   . Brain surgery     No family history on file. Social History  Substance Use Topics  . Smoking status: Current Every Day Smoker  . Smokeless tobacco: None  . Alcohol Use: Yes    Review of Systems A complete 10 system review of systems was obtained and all systems are negative except as noted in the HPI and PMH.    Allergies  Review of patient's allergies indicates no known allergies.  Home Medications   Prior to Admission medications   Medication Sig Start Date End Date Taking? Authorizing Provider  acetaminophen (TYLENOL) 500 MG tablet Take 1,000 mg by mouth every 6 (six) hours as needed for moderate pain.    Historical Provider, MD  naproxen (NAPROSYN) 500 MG tablet Take 1 tablet (500 mg total) by mouth 2 (two) times daily. 01/04/15   Santiago Glad, PA-C  oxyCODONE-acetaminophen (PERCOCET) 5-325 MG per tablet Take 1 tablet by  mouth every 6 (six) hours as needed for moderate pain. 06/22/15   Purvis Sheffield, MD  Pseudoeph-Doxylamine-DM-APAP (NYQUIL PO) Take 1-2 capsules by mouth every 6 (six) hours as needed (sinus pain).    Historical Provider, MD  traMADol (ULTRAM) 50 MG tablet Take 1 tablet (50 mg total) by mouth every 6 (six) hours as needed. 01/04/15   Heather Laisure, PA-C   BP 151/64 mmHg  Pulse 92  Resp 16  Ht  (1.88 m)  Wt 309 lb (140.161 kg)  BMI 39.66 kg/m2  SpO2 100%   Physical Exam  Constitutional: He is oriented to person, place, and time. He appears well-developed and well-nourished. No distress.  HENT:  Head: Normocephalic and atraumatic.  Right Ear: External ear normal.  Left Ear: External ear normal.  Mouth/Throat: No oropharyngeal exudate.  Swollen turbinates BL. Tenderness frontal sinuses  Eyes: Conjunctivae and EOM are normal.  Neck: Neck supple. No tracheal deviation present.  Cardiovascular: Normal rate.   Pulmonary/Chest: Effort normal. No respiratory distress.  Musculoskeletal: Normal range of motion.  Lymphadenopathy:    He has cervical adenopathy.  Neurological: He is alert and oriented to person, place, and time.  Skin: Skin is warm and dry.  Psychiatric: He has a normal mood and affect. His behavior is normal.  Nursing note and vitals reviewed.   ED Course  Procedures  DIAGNOSTIC STUDIES: Oxygen Saturation is 100% on RA, normal by my  interpretation.    COORDINATION OF CARE: 9:11 PM-Discussed treatment plan with pt at bedside and pt agreed to plan.     MDM   Final diagnoses:  Jaw pain   Patient complaining of symptoms of jaw pain and sinusitis.  Patient is afebrile.  Patient d/c'd with Augmentin given history of recent sinus infection treated with z-pack.  Patient instructions given for warm saline nasal washes.  Recommendations for follow-up with primary care physician.   Symptomatic treatment for jaw pain. No concern for ACS.  I personally performed the  services described in this documentation, which was scribed in my presence. The recorded information has been reviewed and is accurate.   Melton KrebsSamantha Nicole Victoriana Aziz, PA-C 04/29/16 2001  Raeford RazorStephen Kohut, MD 05/03/16 [id number]

## 2016-04-25 NOTE — ED Notes (Signed)
Pt stable, ambulatory, states understanding of discharge instructions 

## 2016-04-25 NOTE — ED Notes (Signed)
Pt reports left side of jaw hurts when he opens and closes his mouth, onset this morning when eating breakfast.

## 2016-04-25 NOTE — Discharge Instructions (Signed)
Mr. Edward Manning,  Nice meeting you! Please follow-up with your primary care provider. Return to the emergency department if you develop fevers, chills, shortness of breath, chest pain, trouble swallowing, new/worsening symptoms. Feel better soon!  S. Lane HackerNicole Mykel Sponaugle, PA-C

## 2018-03-10 ENCOUNTER — Emergency Department (HOSPITAL_COMMUNITY)
Admission: EM | Admit: 2018-03-10 | Discharge: 2018-03-10 | Disposition: A | Discharge status: Home / Self Care | Payer: BLUE CROSS/BLUE SHIELD | Attending: Emergency Medicine | Admitting: Emergency Medicine

## 2018-03-10 ENCOUNTER — Other Ambulatory Visit: Payer: Self-pay

## 2018-03-10 ENCOUNTER — Encounter (HOSPITAL_COMMUNITY): Payer: Self-pay

## 2018-03-10 DIAGNOSIS — F17200 Nicotine dependence, unspecified, uncomplicated: Secondary | ICD-10-CM | POA: Diagnosis not present

## 2018-03-10 DIAGNOSIS — Z23 Encounter for immunization: Secondary | ICD-10-CM | POA: Diagnosis not present

## 2018-03-10 DIAGNOSIS — Z79899 Other long term (current) drug therapy: Secondary | ICD-10-CM | POA: Insufficient documentation

## 2018-03-10 DIAGNOSIS — L739 Follicular disorder, unspecified: Secondary | ICD-10-CM | POA: Insufficient documentation

## 2018-03-10 DIAGNOSIS — K130 Diseases of lips: Secondary | ICD-10-CM | POA: Diagnosis present

## 2018-03-10 MED ORDER — MUPIROCIN CALCIUM 2 % EX CREA: 1.0000 "application " | TOPICAL_CREAM | Freq: Two times a day (BID) | CUTANEOUS | 0 refills | Status: AC

## 2018-03-10 MED ORDER — DOXYCYCLINE HYCLATE 100 MG PO TABS
100.0000 mg | ORAL_TABLET | Freq: Once | ORAL | Status: AC
  Administered 2018-03-10: 100 mg via ORAL
  Filled 2018-03-10: qty 1

## 2018-03-10 MED ORDER — TETANUS-DIPHTH-ACELL PERTUSSIS 5-2.5-18.5 LF-MCG/0.5 IM SUSP
0.5000 mL | Freq: Once | INTRAMUSCULAR | Status: AC
  Administered 2018-03-10: 0.5 mL via INTRAMUSCULAR
  Filled 2018-03-10: qty 0.5

## 2018-03-10 MED ORDER — DOXYCYCLINE HYCLATE 100 MG PO CAPS: 100.0000 mg | ORAL_CAPSULE | Freq: Two times a day (BID) | ORAL | 0 refills | Status: AC

## 2018-03-10 NOTE — Discharge Instructions (Addendum)
Please follow with your primary care doctor in the next 2 days for a check-up. They must obtain records for further management.  ° °Do not hesitate to return to the Emergency Department for any new, worsening or concerning symptoms.  ° °

## 2018-03-10 NOTE — ED Triage Notes (Signed)
Pt reports he got a small cut at the barber on Friday. He reports putting ointment on his lip and now its swollen and hot to touch. Pt concerned he could have gotten an infection.

## 2018-03-10 NOTE — ED Provider Notes (Signed)
[facility] EMERGENCY DEPARTMENT Provider Note   CSN: [id number] Arrival date & time: 03/10/18  4098     History   Chief Complaint Chief Complaint  Patient presents with  . Oral Swelling   HPI   Blood pressure 132/86, pulse 74, temperature 98 F (36.7 C), temperature source Oral, resp. rate 16, height 6\' 2"  (1.88 m), SpO2 99 %.  Edward Manning is a 37 y.o. male complaining of swelling and pain to the lower lip worsening after several days ago.  Patient is not a diabetic, no fever or chills but the area feels warm.  He has been using an over-the-counter cream with little relief.  He believes the symptoms are getting worse.  Last shot is unknown.   Past Medical History:  Diagnosis Date  . Brain aneurysm   . Knee pain     There are no active problems to display for this patient.   Past Surgical History:  Procedure Laterality Date  . BRAIN SURGERY    . KNEE SURGERY Right         Home Medications    Prior to Admission medications   Medication Sig Start Date End Date Taking? Authorizing Provider  acetaminophen (TYLENOL) 500 MG tablet Take 1,000 mg by mouth every 6 (six) hours as needed for moderate pain.    [provider]  amoxicillin-clavulanate (AUGMENTIN) 875-125 MG tablet Take 1 tablet by mouth every 12 (twelve) hours. 04/25/16   Melton Krebs, PA-C  doxycycline (VIBRAMYCIN) 100 MG capsule Take 1 capsule (100 mg total) by mouth 2 (two) times daily. 03/10/18   Clevester Helzer, Joni Reining, PA-C  mupirocin cream (BACTROBAN) 2 % Apply 1 application topically 2 (two) times daily. 03/10/18   Babe Anthis, Joni Reining, PA-C  naproxen (NAPROSYN) 500 MG tablet Take 1 tablet (500 mg total) by mouth 2 (two) times daily. 01/04/15   Santiago Glad, PA-C  oxyCODONE-acetaminophen (PERCOCET) 5-325 MG per tablet Take 1 tablet by mouth every 6 (six) hours as needed for moderate pain. 06/22/15   Purvis Sheffield, MD  Pseudoeph-Doxylamine-DM-APAP (NYQUIL PO) Take 1-2  capsules by mouth every 6 (six) hours as needed (sinus pain).    [provider]  traMADol (ULTRAM) 50 MG tablet Take 1 tablet (50 mg total) by mouth every 6 (six) hours as needed. 01/04/15   Santiago Glad, PA-C    Family History History reviewed. No pertinent family history.  Social History Social History   Tobacco Use  . Smoking status: Current Every Day Smoker  . Smokeless tobacco: Never Used  Substance Use Topics  . Alcohol use: Yes  . Drug use: No     Allergies   Patient has no known allergies.   Review of Systems Review of Systems  A complete review of systems was obtained and all systems are negative except as noted in the HPI and PMH.   Physical Exam Updated Vital Signs BP 132/86 (BP Location: Right Arm)   Pulse 74   Temp 98 F (36.7 C) (Oral)   Resp 16   Ht 6\' 2"  (1.88 m)   SpO2 99%   BMI 39.67 kg/m   Physical Exam  Constitutional: He is oriented to person, place, and time. He appears well-developed and well-nourished. No distress.  HENT:  Head: Normocephalic and atraumatic.  Mouth/Throat: Oropharynx is clear and moist.  Eyes: Pupils are equal, round, and reactive to light. Conjunctivae and EOM are normal.  Neck: Normal range of motion.  Cardiovascular: Normal rate, regular rhythm and intact distal pulses.  Pulmonary/Chest: Effort normal and breath sounds normal. No stridor. No respiratory distress. He has no wheezes. He has no rales. He exhibits no tenderness.  Abdominal: Soft. There is no tenderness.  Musculoskeletal: Normal range of motion.  Neurological: He is alert and oriented to person, place, and time.  Skin: He is not diaphoretic.  Grouped vesicular lesions to lower lip with crusting in the hair just inferior to the vesicles.  No surrounding cellulitis  Psychiatric: He has a normal mood and affect.  Nursing note and vitals reviewed.    ED Treatments / Results  Labs (all labs ordered are listed, but only abnormal results are  displayed) Labs Reviewed - No data to display  EKG None  Radiology No results found.  Procedures Procedures (including critical care time)  Medications Ordered in ED Medications  Tdap (BOOSTRIX) injection 0.5 mL (has no administration in time range)  doxycycline (VIBRA-TABS) tablet 100 mg (has no administration in time range)     Initial Impression / Assessment and Plan / ED Course  I have reviewed the triage vital signs and the nursing notes.  Pertinent labs & imaging results that were available during my care of the patient were reviewed by me and considered in my medical decision making (see chart for details).     Vitals:   03/10/18 0834  BP: 132/86  Pulse: 74  Resp: 16  Temp: 98 F (36.7 C)  TempSrc: Oral  SpO2: 99%  Height: 6\' 2"  (1.88 m)    Medications  Tdap (BOOSTRIX) injection 0.5 mL (has no administration in time range)  doxycycline (VIBRA-TABS) tablet 100 mg (has no administration in time range)    Edward Manning is 37 y.o. male presenting with worsening rash to lower left after he was cut at the barber shop several days ago.  Will treat for folliculitis, crusting suggestive of MSSA.  Will give Bactroban in addition to doxy.  Tetanus updated today.  Evaluation does not show pathology that would require ongoing emergent intervention or inpatient treatment. Pt is hemodynamically stable and mentating appropriately. Discussed findings and plan with patient/guardian, who agrees with care plan. All questions answered. Return precautions discussed and outpatient follow up given.      Final Clinical Impressions(s) / ED Diagnoses   Final diagnoses:  Folliculitis    ED Discharge Orders        Ordered    mupirocin cream (BACTROBAN) 2 %  2 times daily     03/10/18 0928    doxycycline (VIBRAMYCIN) 100 MG capsule  2 times daily     03/10/18 0928       Korde Jeppsen, Mardella Laymanicole, PA-C 03/10/18 [id number]    Gerhard MunchLockwood, Robert, MD 03/10/18 [phone]

## 2018-03-10 NOTE — ED Notes (Signed)
States was at the barber on Friday and he scraped his bottom lip with clippers now having some pain and swelling, states did put some ointment on there but thinks it made it worse
